# Patient Record
Sex: Male | Born: 1945 | Race: White | Hispanic: No | Smoking: Current every day smoker
Health system: Southern US, Community
[De-identification: ages and names within clinical notes are randomized; demographics above are authoritative.]

## PROBLEM LIST (undated history)

## (undated) DIAGNOSIS — I10 Essential (primary) hypertension: Secondary | ICD-10-CM

## (undated) DIAGNOSIS — E119 Type 2 diabetes mellitus without complications: Secondary | ICD-10-CM

## (undated) DIAGNOSIS — E785 Hyperlipidemia, unspecified: Secondary | ICD-10-CM

## (undated) DIAGNOSIS — I25118 Atherosclerotic heart disease of native coronary artery with other forms of angina pectoris: Secondary | ICD-10-CM

## (undated) DIAGNOSIS — R3589 Other polyuria: Secondary | ICD-10-CM

## (undated) DIAGNOSIS — F32A Depression, unspecified: Secondary | ICD-10-CM

## (undated) DIAGNOSIS — F329 Major depressive disorder, single episode, unspecified: Secondary | ICD-10-CM

## (undated) DIAGNOSIS — R358 Other polyuria: Secondary | ICD-10-CM

## (undated) DIAGNOSIS — E669 Obesity, unspecified: Secondary | ICD-10-CM

## (undated) DIAGNOSIS — I739 Peripheral vascular disease, unspecified: Secondary | ICD-10-CM

## (undated) DIAGNOSIS — I509 Heart failure, unspecified: Secondary | ICD-10-CM

## (undated) DIAGNOSIS — I6529 Occlusion and stenosis of unspecified carotid artery: Secondary | ICD-10-CM

## (undated) HISTORY — DX: Other polyuria: R35.89

## (undated) HISTORY — DX: Atherosclerotic heart disease of native coronary artery with other forms of angina pectoris: I25.118

## (undated) HISTORY — DX: Peripheral vascular disease, unspecified: I73.9

## (undated) HISTORY — DX: Depression, unspecified: F32.A

## (undated) HISTORY — DX: Essential (primary) hypertension: I10

## (undated) HISTORY — DX: Major depressive disorder, single episode, unspecified: F32.9

## (undated) HISTORY — DX: Heart failure, unspecified: I50.9

## (undated) HISTORY — DX: Obesity, unspecified: E66.9

## (undated) HISTORY — PX: CAROTID ENDARTERECTOMY: SUR193

## (undated) HISTORY — DX: Hyperlipidemia, unspecified: E78.5

## (undated) HISTORY — DX: Type 2 diabetes mellitus without complications: E11.9

## (undated) HISTORY — DX: Occlusion and stenosis of unspecified carotid artery: I65.29

## (undated) HISTORY — DX: Other polyuria: R35.8

---

## 2000-05-02 ENCOUNTER — Encounter: Payer: Self-pay | Admitting: Emergency Medicine

## 2000-05-02 ENCOUNTER — Inpatient Hospital Stay (HOSPITAL_COMMUNITY): Admission: EM | Admit: 2000-05-02 | Discharge: 2000-05-05 | Payer: Self-pay | Admitting: Emergency Medicine

## 2015-06-15 ENCOUNTER — Encounter: Payer: Self-pay | Admitting: Vascular Surgery

## 2015-06-15 ENCOUNTER — Other Ambulatory Visit: Payer: Self-pay | Admitting: *Deleted

## 2015-06-15 DIAGNOSIS — I739 Peripheral vascular disease, unspecified: Secondary | ICD-10-CM

## 2015-07-06 ENCOUNTER — Ambulatory Visit
Admission: RE | Admit: 2015-07-06 | Discharge: 2015-07-06 | Disposition: A | Discharge status: Home / Self Care | Payer: Medicare Other | Source: Ambulatory Visit | Attending: Internal Medicine | Admitting: Internal Medicine

## 2015-07-06 ENCOUNTER — Other Ambulatory Visit: Payer: Self-pay | Admitting: Internal Medicine

## 2015-07-06 DIAGNOSIS — R0781 Pleurodynia: Secondary | ICD-10-CM

## 2015-07-06 MED ORDER — IOPAMIDOL (ISOVUE-370) INJECTION 76%
80.0000 mL | Freq: Once | INTRAVENOUS | Status: AC | PRN
  Administered 2015-07-06: 80 mL via INTRAVENOUS

## 2015-07-08 ENCOUNTER — Encounter: Payer: Self-pay | Admitting: Vascular Surgery

## 2015-07-09 ENCOUNTER — Encounter: Payer: Self-pay | Admitting: Vascular Surgery

## 2015-07-09 ENCOUNTER — Ambulatory Visit (HOSPITAL_COMMUNITY)
Admission: RE | Admit: 2015-07-09 | Discharge: 2015-07-09 | Disposition: A | Discharge status: Home / Self Care | Payer: Medicare Other | Source: Ambulatory Visit | Attending: Vascular Surgery | Admitting: Vascular Surgery

## 2015-07-09 ENCOUNTER — Ambulatory Visit (INDEPENDENT_AMBULATORY_CARE_PROVIDER_SITE_OTHER)
Admission: RE | Admit: 2015-07-09 | Discharge: 2015-07-09 | Disposition: A | Discharge status: Home / Self Care | Payer: Medicare Other | Source: Ambulatory Visit | Attending: Vascular Surgery | Admitting: Vascular Surgery

## 2015-07-09 ENCOUNTER — Ambulatory Visit (INDEPENDENT_AMBULATORY_CARE_PROVIDER_SITE_OTHER): Payer: Medicare Other | Admitting: Vascular Surgery

## 2015-07-09 VITALS — BP 146/86 | HR 72 | Ht 72.0 in | Wt 252.1 lb

## 2015-07-09 DIAGNOSIS — Z9862 Peripheral vascular angioplasty status: Secondary | ICD-10-CM | POA: Diagnosis not present

## 2015-07-09 DIAGNOSIS — I10 Essential (primary) hypertension: Secondary | ICD-10-CM | POA: Diagnosis not present

## 2015-07-09 DIAGNOSIS — E119 Type 2 diabetes mellitus without complications: Secondary | ICD-10-CM | POA: Diagnosis not present

## 2015-07-09 DIAGNOSIS — I70219 Atherosclerosis of native arteries of extremities with intermittent claudication, unspecified extremity: Secondary | ICD-10-CM | POA: Diagnosis not present

## 2015-07-09 DIAGNOSIS — I739 Peripheral vascular disease, unspecified: Secondary | ICD-10-CM

## 2015-07-09 NOTE — Progress Notes (Signed)
Referred by:  Alysia Penna, MD [address] [place], [place] [address]  Reason for referral: abnormal ABI  History of Present Illness  Todd [name] is a 69 y.o. (03/02/1946) male who presents with chief complaint: B leg pain.  Onset of symptom occurred >3 years ago.  Pain is described as cramping, severity 3-6/10, and associated with ambulation ~0.25 mile.  Patient has attempted to treat this pain with left SFA angioplasty.  He has been under the care of vascular surgeon in [place].  He has also previous had a R CEA completed.  The patient has no rest pain symptoms also and no leg wounds/ulcers.  Atherosclerotic risk factors include: DM, HLD, HTN, and active smoking.  Past Medical History  Diagnosis Date  . Diabetes mellitus without complication   . Obesity   . Peripheral arterial disease   . Depression   . Carotid stenosis   . Hyperlipidemia   . Hypertension   . 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 Polyuria   . CHF (congestive heart failure)     Past Surgical History  Procedure Laterality Date  . Carotid endarterectomy      Social History   Social History  . Marital Status: Unknown    Spouse Name: N/A  . Number of Children: N/A  . Years of Education: N/A   Occupational History  . Not on file.   Social History Main Topics  . Smoking status: Current Every Day Smoker -- 1.00 packs/day    Types: Cigarettes  . Smokeless tobacco: Not on file  . Alcohol Use: 4.2 oz/week    0 Standard drinks or equivalent, 7 Glasses of wine per week  . Drug Use: No  . Sexual Activity: Not on file   Other Topics Concern  . Not on file   Social History Narrative    Family History  Problem Relation Age of Onset  . Peripheral vascular disease Father     Current Outpatient Prescriptions on File Prior to Visit  Medication Sig Dispense Refill  . aspirin 81 MG tablet Take 81 mg by mouth daily.    . citalopram (CELEXA) 40 MG tablet Take 40 mg by mouth  daily.    . insulin lispro protamine-lispro (HUMALOG 75/25 MIX) (75-25) 100 UNIT/ML SUSP injection Inject 57 Units into the skin 2 (two) times daily.    Marland Kitchen losartan (COZAAR) 25 MG tablet Take 25 mg by mouth daily.    . tamsulosin (FLOMAX) 0.4 MG CAPS capsule Take 0.4 mg by mouth daily.     No current facility-administered medications on file prior to visit.    No Known Allergies  REVIEW OF SYSTEMS:  (Positives checked otherwise negative)  CARDIOVASCULAR:  [ ]  chest pain, [ ]  chest pressure, [ ]  palpitations, [ ]  shortness of breath when laying flat, [ ]  shortness of breath with exertion,   [x]  pain in feet when walking, [ ]  pain in feet when laying flat, [ ]  history of blood clot in veins (DVT), [ ]  history of phlebitis, [ ]  swelling in legs, [ ]  varicose veins  PULMONARY:  [x]  productive cough, [ ]  asthma, [ ]  wheezing  NEUROLOGIC:  [x]  weakness in arms or legs, [ ]  numbness in arms or legs, [ ]  difficulty speaking or slurred speech, [ ]  temporary loss of vision in one eye, [ ]  dizziness  HEMATOLOGIC:  [ ]  bleeding problems, [ ]  problems with blood clotting too easily  MUSCULOSKEL:  [ ]   joint pain, [ ]  joint swelling  GASTROINTEST:  [ ]   Vomiting blood, [ ]   Blood in stool     GENITOURINARY:  [ ]   Burning with urination, [ ]   Blood in urine  PSYCHIATRIC:  [ ]  history of major depression  INTEGUMENTARY:  [ ]  rashes, [ ]  ulcers  CONSTITUTIONAL:  [ ]  fever, [ ]  chills   For VQI Use Only   PRE-ADM LIVING: Home  AMB STATUS: Ambulatory  CAD Sx: None  PRIOR CHF: Asymptomatic, History of CHF  STRESS TEST: [x]  No, [ ]  Normal, [ ]  + ischemia, [ ]  + MI, [ ]  Both    Physical Examination Filed Vitals:   07/09/15 1505  BP: 146/86  Pulse: 72  Height: 6' (1.829 m)  Weight: 252 lb 1.6 oz (114.352 kg)  SpO2: 96%   Body mass index is 34.18 kg/(m^2).  General: A&O x 3, WD, mildly obese  Head: [place]/AT  Ear/Nose/Throat: Hearing grossly intact, nares w/o erythema or drainage,  oropharynx w/o Erythema/Exudate  Eyes: PERRLA, EOMI  Neck: Supple, no nuchal rigidity, no palpable LAD, healed R neck incision  Pulmonary: Sym exp, good air movt, CTAB, no rales, rhonchi, & wheezing  Cardiac: RRR, Nl S1, S2, no Murmurs, rubs or gallops  Vascular: Vessel Right Left  Radial Palpable Palpable  Brachial  Palpable Palpable  Carotid Palpable, without bruit Palpable, without bruit  Aorta Not palpable N/A  Femoral Palpable Palpable  Popliteal Not palpable Not palpable  PT Not Palpable Not Palpable  DP Not Palpable Not Palpable   Gastrointestinal: soft, NTND, -G/R, - HSM, - masses, - CVAT B, no palpable AAA  Musculoskeletal: M/S 5/5 throughout , Extremities without ischemic changes   Neurologic: CN 2-12 intact , Pain and light touch intact in extremities , Motor exam as listed above  Psychiatric: Judgment intact, Mood & affect appropriatefor pt's clinical situation  Dermatologic: See M/S exam for extremity exam, no rashes otherwise noted  Lymph : No Cervical, Axillary, or Inguinal lymphadenopathy    Non-Invasive Vascular Imaging  ABI (Date: 07/09/2015)  R: 0.69, DP: mono, PT: mono, TBI: 0.48  L: 0.64, DP: mono, PT: mono, TBI: 0.39  BLE arterial duplex (07/09/2015)  Occluded B SFA  Possible R PFA stenosis  Patent popliteal arteries with 3 vessel runoff  Outside Studies/Documentation 6 pages of outside documents were reviewed including: outpatient PCP records including outside B carotid duplex and outside ABI.   Medical Decision Making  Todd [name] is a 69 y.o. male who presents with: BLE intermittent claudication, s/p R CEA (outside practice)   I discussed with the patient the natural history of intermittent claudication: 75% of patients have stable or improved symptoms in a year an only 2% require amputation. Eventually 20% may require intervention in a year.  I discussed in depth with the patient the nature of atherosclerosis, and emphasized the  importance of maximal medical management including strict control of blood pressure, blood glucose, and lipid levels, antiplatelet agent, obtaining regular exercise, and cessation of smoking.    The patient is aware that without maximal medical management the underlying atherosclerotic disease process will progress, limiting the benefit of any interventions.  I discussed in depth with the patient a walking plan and how to execute such.  The patient is not interested in starting Pletal. The patient is currently on a statin: reported has some intolerance to such.  Would trial the patient on different agents to see if one can be found that he tolerates.  The patient is currently on an anti-platelet: ASA.  Repeat the ABI in 3 months.  His R foot perfusion is dependent on his profunda femoral artery collaterals, which might eventually become compromised by the stenosis in that vessel.  The L SFA occlusion despite angioplasty is pretty much the normal given the poor patencies with endovascular intervention on the SFA.  Follow up in 3 months.  Thank you for allowing [place] to participate in this patient's care.  Leonides Sake, MD [facility] Office: [phone] Pager: [phone]  07/09/2015, 5:19 PM

## 2015-10-08 ENCOUNTER — Ambulatory Visit: Payer: Medicare Other | Admitting: Vascular Surgery

## 2015-10-08 ENCOUNTER — Encounter (HOSPITAL_COMMUNITY): Payer: Medicare Other

## 2016-02-07 ENCOUNTER — Encounter: Payer: Self-pay | Admitting: Vascular Surgery

## 2016-02-11 ENCOUNTER — Encounter: Payer: Self-pay | Admitting: Vascular Surgery

## 2016-02-11 ENCOUNTER — Ambulatory Visit (HOSPITAL_COMMUNITY)
Admission: RE | Admit: 2016-02-11 | Discharge: 2016-02-11 | Disposition: A | Discharge status: Home / Self Care | Payer: Medicare Other | Source: Ambulatory Visit | Attending: Vascular Surgery | Admitting: Vascular Surgery

## 2016-02-11 ENCOUNTER — Ambulatory Visit (INDEPENDENT_AMBULATORY_CARE_PROVIDER_SITE_OTHER): Payer: Medicare Other | Admitting: Vascular Surgery

## 2016-02-11 ENCOUNTER — Other Ambulatory Visit: Payer: Self-pay | Admitting: *Deleted

## 2016-02-11 VITALS — BP 188/86 | HR 61 | Ht 72.0 in | Wt 255.1 lb

## 2016-02-11 DIAGNOSIS — E119 Type 2 diabetes mellitus without complications: Secondary | ICD-10-CM | POA: Diagnosis not present

## 2016-02-11 DIAGNOSIS — E785 Hyperlipidemia, unspecified: Secondary | ICD-10-CM | POA: Insufficient documentation

## 2016-02-11 DIAGNOSIS — I739 Peripheral vascular disease, unspecified: Secondary | ICD-10-CM

## 2016-02-11 DIAGNOSIS — I70213 Atherosclerosis of native arteries of extremities with intermittent claudication, bilateral legs: Secondary | ICD-10-CM | POA: Diagnosis not present

## 2016-02-11 DIAGNOSIS — I509 Heart failure, unspecified: Secondary | ICD-10-CM | POA: Insufficient documentation

## 2016-02-11 DIAGNOSIS — F329 Major depressive disorder, single episode, unspecified: Secondary | ICD-10-CM | POA: Insufficient documentation

## 2016-02-11 DIAGNOSIS — I6523 Occlusion and stenosis of bilateral carotid arteries: Secondary | ICD-10-CM

## 2016-02-11 DIAGNOSIS — I110 Hypertensive heart disease with heart failure: Secondary | ICD-10-CM | POA: Insufficient documentation

## 2016-02-11 DIAGNOSIS — R0989 Other specified symptoms and signs involving the circulatory and respiratory systems: Secondary | ICD-10-CM | POA: Diagnosis present

## 2016-02-11 NOTE — Progress Notes (Signed)
[facility]  Patient name: Todd [name] MRN: [id number] DOB: 12/13/1945 Sex: male  REASON FOR VISIT: Follow-up claudication  HPI: Todd [name] is a 70 y.o. male who presents for follow-up of his bilateral leg claudication. The patient has known bilateral SFA occlusion. The patient was last seen 6 months ago. At that time, he complained of bilateral calf claudication with ambulating a quarter mile. The patient has previously been treated with left SFA angioplasty by a vascular surgeon in [place]. The patient denies any rest pain or nonhealing wounds. The patient states that he has not been very motivated to walk recently. He states that his ambulation distance is about the same.  The patient continues to smoke a pack to pack and a half a day. He has a past medical history of diabetes on insulin, hyperlipidemia and hypertension. He is status post right carotid endarterectomy from outside practice.  The patient is on aspirin. He reports an intolerance to statins.   Past Medical History  Diagnosis Date  . Diabetes mellitus without complication (HCC)   . Obesity   . Peripheral arterial disease (HCC)   . Depression   . Carotid stenosis   . Hyperlipidemia   . Hypertension   . 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 Polyuria   . CHF (congestive heart failure) (HCC)     Family History  Problem Relation Age of Onset  . Peripheral vascular disease Father     SOCIAL HISTORY: Social History  Substance Use Topics  . Smoking status: Current Every Day Smoker -- 1.00 packs/day    Types: Cigarettes  . Smokeless tobacco: Not on file  . Alcohol Use: 4.2 oz/week    0 Standard drinks or equivalent, 7 Glasses of wine per week    No Known Allergies  Current Outpatient Prescriptions  Medication Sig Dispense Refill  . aspirin 81 MG tablet Take 81 mg by mouth daily.    . citalopram (CELEXA) 40 MG tablet Take 40 mg by mouth daily.    .  CRESTOR 10 MG tablet     . HUMALOG MIX 75/25 KWIKPEN (75-25) 100 UNIT/ML Kwikpen     . insulin lispro protamine-lispro (HUMALOG 75/25 MIX) (75-25) 100 UNIT/ML SUSP injection Inject 57 Units into the skin 2 (two) times daily.    Marland Kitchen. losartan (COZAAR) 25 MG tablet Take 25 mg by mouth daily.    . methocarbamol (ROBAXIN) 500 MG tablet     . tamsulosin (FLOMAX) 0.4 MG CAPS capsule Take 0.4 mg by mouth daily.    . traMADol (ULTRAM) 50 MG tablet     . HYDROcodone-acetaminophen (NORCO/VICODIN) 5-325 MG per tablet Reported on 02/11/2016  0  . Oxycodone-Acetaminophen (PERCOCET PO) Take by mouth. Reported on 02/11/2016    . oxyCODONE-acetaminophen (PERCOCET/ROXICET) 5-325 MG per tablet Reported on 02/11/2016     No current facility-administered medications for this visit.    REVIEW OF SYSTEMS:  [X]  denotes positive finding, [ ]  denotes negative finding Cardiac  Comments:  Chest pain or chest pressure:    Shortness of breath upon exertion:    Short of breath when lying flat:    Irregular heart rhythm:        Vascular    Pain in calf, thigh, or hip brought on by ambulation:    Pain in feet at night that wakes you up from your sleep:     Blood clot in your veins:  Leg swelling:         Pulmonary    Oxygen at home:    Productive cough:  x   Wheezing:         Neurologic    Sudden weakness in arms or legs:     Sudden numbness in arms or legs:     Sudden onset of difficulty speaking or slurred speech:    Temporary loss of vision in one eye:     Problems with dizziness:         Gastrointestinal    Blood in stool:     Vomited blood:         Genitourinary    Burning when urinating:     Blood in urine:        Psychiatric    Major depression:         Hematologic    Bleeding problems:    Problems with blood clotting too easily:        Skin    Rashes or ulcers:        Constitutional    Fever or chills:      PHYSICAL EXAM: Filed Vitals:   02/11/16 1442 02/11/16 1444  BP: 186/87  188/86  Pulse: 61   Height: 6' (1.829 m)   Weight: 255 lb 1.6 oz (115.713 kg)   SpO2: 95%     GENERAL: The patient is a well-nourished male, in no acute distress. The vital signs are documented above. CARDIAC: There is a regular rate and rhythm. No carotid bruits. VASCULAR:  2+ radial pulses bilaterally, 2+ femoral pulses bilaterally, nonpalpable popliteal and pedal pulses bilaterally PULMONARY: There is good air exchange bilaterally without wheezing or rales. ABDOMEN: Soft and non-tender  MUSCULOSKELETAL: There are no major deformities or cyanosis. NEUROLOGIC: No focal weakness or paresthesias are detected. SKIN: There are no ulcers or rashes  of lower extremities. PSYCHIATRIC: The patient has a normal affect.  DATA:  His ABIs 02/11/2016  Right lower extremity: 0.66 with monophasic posterior tibial and dorsalis pedis waveforms. TBI 0.68 Left lower extremity: 0.65 with monophasic posterior tibial and dorsalis pedis waveforms. 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 Fortunately, he has not developed rest pain or nonhealing wounds. The patient continues to smoke. He was counseled for greater than 3 minutes on smoking cessation. Discussed maximal medical management with control of blood pressure, lipid levels and blood glucose. The patient is on aspirin daily. He is not on a statin due to intolerance. Advised the patient to start a walking program. The patient will follow-up in 6 months with ABIs and carotid duplex. He knows to return sooner if he develops any worsening symptoms or nonhealing wounds.   Maris Berger, PA-C [facility]  Addendum  I have independently interviewed and examined the patient, and I agree with the  physician assistant's findings.  Pt continues to have non-lifestyle limiting intermittent claudication.  We rediscussed maximal medical management including walking pain.  He needs to work on smoking cessation.  He will follow up with surveillance studies in 6 months.  Leonides Sake, MD [facility] Office: [phone] Pager: [phone]  02/11/2016, 3:56 PM

## 2016-02-15 ENCOUNTER — Encounter: Payer: Self-pay | Admitting: Internal Medicine

## 2016-07-18 ENCOUNTER — Encounter: Payer: Self-pay | Admitting: Vascular Surgery

## 2016-08-11 ENCOUNTER — Encounter: Payer: Self-pay | Admitting: Vascular Surgery

## 2016-08-18 ENCOUNTER — Ambulatory Visit (INDEPENDENT_AMBULATORY_CARE_PROVIDER_SITE_OTHER)
Admission: RE | Admit: 2016-08-18 | Discharge: 2016-08-18 | Disposition: A | Discharge status: Home / Self Care | Payer: Medicare Other | Source: Ambulatory Visit | Attending: Vascular Surgery | Admitting: Vascular Surgery

## 2016-08-18 ENCOUNTER — Ambulatory Visit (HOSPITAL_COMMUNITY)
Admission: RE | Admit: 2016-08-18 | Discharge: 2016-08-18 | Disposition: A | Discharge status: Home / Self Care | Payer: Medicare Other | Source: Ambulatory Visit | Attending: Vascular Surgery | Admitting: Vascular Surgery

## 2016-08-18 ENCOUNTER — Ambulatory Visit: Payer: Medicare Other | Admitting: Vascular Surgery

## 2016-08-18 DIAGNOSIS — I10 Essential (primary) hypertension: Secondary | ICD-10-CM | POA: Insufficient documentation

## 2016-08-18 DIAGNOSIS — I739 Peripheral vascular disease, unspecified: Secondary | ICD-10-CM | POA: Insufficient documentation

## 2016-08-18 DIAGNOSIS — I6523 Occlusion and stenosis of bilateral carotid arteries: Secondary | ICD-10-CM | POA: Insufficient documentation

## 2016-08-18 DIAGNOSIS — E119 Type 2 diabetes mellitus without complications: Secondary | ICD-10-CM | POA: Diagnosis not present

## 2016-08-18 DIAGNOSIS — Z720 Tobacco use: Secondary | ICD-10-CM | POA: Insufficient documentation

## 2016-08-18 LAB — VAS US CAROTID
LCCADDIAS: -19 cm/s
LCCAPSYS: 106 cm/s
LEFT ECA DIAS: -16 cm/s
Left CCA dist sys: -77 cm/s
Left CCA prox dias: 18 cm/s
Left ICA dist dias: -20 cm/s
Left ICA dist sys: -65 cm/s
Left ICA prox dias: -15 cm/s
Left ICA prox sys: -58 cm/s
RCCADSYS: -61 cm/s
RCCAPDIAS: 9 cm/s
RCCAPSYS: 71 cm/s
RIGHT CCA MID DIAS: 12 cm/s
RIGHT ECA DIAS: -12 cm/s

## 2016-08-22 ENCOUNTER — Encounter: Payer: Self-pay | Admitting: Vascular Surgery

## 2016-08-28 ENCOUNTER — Encounter: Payer: Self-pay | Admitting: Vascular Surgery

## 2016-08-28 ENCOUNTER — Ambulatory Visit (INDEPENDENT_AMBULATORY_CARE_PROVIDER_SITE_OTHER): Payer: Medicare Other | Admitting: Vascular Surgery

## 2016-08-28 VITALS — BP 154/94 | HR 81 | Temp 98.3°F | Resp 16 | Ht 72.0 in | Wt 247.0 lb

## 2016-08-28 DIAGNOSIS — I6523 Occlusion and stenosis of bilateral carotid arteries: Secondary | ICD-10-CM | POA: Insufficient documentation

## 2016-08-28 DIAGNOSIS — I739 Peripheral vascular disease, unspecified: Secondary | ICD-10-CM | POA: Insufficient documentation

## 2016-08-28 DIAGNOSIS — I70213 Atherosclerosis of native arteries of extremities with intermittent claudication, bilateral legs: Secondary | ICD-10-CM

## 2016-08-28 NOTE — Progress Notes (Signed)
Patient name: Todd [name] MRN: [id number] DOB: Aug 27, 1946 Sex: male    HPI: Todd MIMBS is a 70 y.o. male,  Who is here for a 6 month follow visit.  He had a right CEA and a left SFA angioplasty in [place], [place].  He denise ulcers and rest pain in B LE.  He denise symptoms of stroke or TIA.   Other medical problems include  Atherosclerotic risk factors include: DM, HLD, HTN, and active smoking.  Past Medical History:  Diagnosis Date  . 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 Carotid stenosis   . CHF (congestive heart failure) (HCC)   . Depression   . Diabetes mellitus without complication (HCC)   . Hyperlipidemia   . Hypertension   . Obesity   . Peripheral arterial disease (HCC)   . Polyuria    Past Surgical History:  Procedure Laterality Date  . CAROTID ENDARTERECTOMY      Family History  Problem Relation Age of Onset  . Peripheral vascular disease Father     SOCIAL HISTORY: Social History   Social History  . Marital status: Unknown    Spouse name: N/A  . Number of children: N/A  . Years of education: N/A   Occupational History  . Not on file.   Social History Main Topics  . Smoking status: Current Every Day Smoker    Types: Cigarettes  . Smokeless tobacco: Never Used     Comment: Up 1 1/2 pks per day  . Alcohol use 4.2 oz/week    7 Glasses of wine per week  . Drug use: No  . Sexual activity: Not on file   Other Topics Concern  . Not on file   Social History Narrative  . No narrative on file    No Known Allergies  Current Outpatient Prescriptions  Medication Sig Dispense Refill  . aspirin 81 MG tablet Take 81 mg by mouth daily.    . citalopram (CELEXA) 40 MG tablet Take 40 mg by mouth daily.    . CRESTOR 10 MG tablet     . HUMALOG MIX 75/25 KWIKPEN (75-25) 100 UNIT/ML Kwikpen     . HYDROcodone-acetaminophen (NORCO/VICODIN) 5-325 MG per tablet Reported on 02/11/2016  0  . insulin lispro protamine-lispro  (HUMALOG 75/25 MIX) (75-25) 100 UNIT/ML SUSP injection Inject 57 Units into the skin 2 (two) times daily.    Marland Kitchen losartan (COZAAR) 25 MG tablet Take 25 mg by mouth daily.    . methocarbamol (ROBAXIN) 500 MG tablet     . Oxycodone-Acetaminophen (PERCOCET PO) Take by mouth. Reported on 02/11/2016    . oxyCODONE-acetaminophen (PERCOCET/ROXICET) 5-325 MG per tablet Reported on 02/11/2016    . tamsulosin (FLOMAX) 0.4 MG CAPS capsule Take 0.4 mg by mouth daily.    . traMADol (ULTRAM) 50 MG tablet      No current facility-administered medications for this visit.     ROS:   General:  No weight loss, Fever, chills  HEENT: No recent headaches, no nasal bleeding, no visual changes, no sore throat  Neurologic: No dizziness, blackouts, seizures. No recent symptoms of stroke or mini- stroke. No recent episodes of slurred speech, or temporary blindness.  Cardiac: No recent episodes of chest pain/pressure, no shortness of breath at rest.  No shortness of breath with exertion.  Denies history of atrial fibrillation or irregular heartbeat  Vascular: No history of rest pain in feet.  No history  of claudication.  No history of non-healing ulcer, No history of DVT   Pulmonary: No home oxygen, no productive cough, no hemoptysis,  No asthma or wheezing  Musculoskeletal:  [ ]  Arthritis, [ ]  Low back pain,  [ ]  Joint pain  Hematologic:No history of hypercoagulable state.  No history of easy bleeding.  No history of anemia  Gastrointestinal: No hematochezia or melena,  No gastroesophageal reflux, no trouble swallowing  Urinary: [ ]  chronic Kidney disease, [ ]  on HD - [ ]  MWF or [ ]  TTHS, [ ]  Burning with urination, [ ]  Frequent urination, [ ]  Difficulty urinating;   Skin: No rashes  Psychological: No history of anxiety,  No history of depression   Physical Examination  Vitals:   08/28/16 1257 08/28/16 1302  BP: (!) 165/86 (!) 154/94  Pulse: 81   Resp: 16   Temp: 98.3 F (36.8 C)   TempSrc: Oral     SpO2: 93%   Weight: 247 lb (112 kg)   Height: 6' (1.829 m)     Body mass index is 33.5 kg/m.  General:  Alert and oriented, no acute distress HEENT: Normal Neck: No bruit or JVD Pulmonary: Clear to auscultation bilaterally Cardiac: Regular Rate and Rhythm without murmur Abdomen: Soft, non-tender, non-distended, no mass, no scars Skin: No rash Extremity Pulses:  2+ radial, brachial, femoral, dorsalis pedis, posterior tibial pulses bilaterally Musculoskeletal: No deformity or edema  Neurologic: Upper and lower extremity motor 5/5 and symmetric  DATA:  ABI Right 0.62 Left 0.68 Carotid duplex Bilaterall 1-39% stenosis with a patent right carotid s/p CEA.   ASSESSMENT:   PAD with symptoms of claudication Carotid stenosis s/p right CEA   PLAN:   Continue to maintain mobility.  Check feet daily for wounds.  His symptoms are stable without new complications.  He will f/u for repeat ABI in 6 months and carotid duplex in 1 year.  If he has symptoms of rest pain or wounds on his LE he will call sooner.   Todd [name], Todd [name] MAUREEN PA-C [facility] The patient was seen in conjunction with Dr. Imogene Burnhen today  Addendum  I have independently interviewed and examined the patient, and I agree with the physician assistant's findings.  R CEA site appears patent.  Minimal disease in L ICA.  Pt's BLE ABI appear to be stable.  He has minimal sx at this point.    - Repeat surveillance studies in 6 months - Maximal medical mgmt - Smoking cessation again emphasized  Todd SakeBrian Chen, MD, FACS [facility] Office: [phone] Pager: [phone]  08/28/2016, 2:05 PM

## 2016-08-28 NOTE — Addendum Note (Signed)
Addended by: Yolonda KidaEVANS, ASHLEIGH N on: 08/28/2016 04:58 PM   Modules accepted: Orders

## 2017-02-22 ENCOUNTER — Encounter: Payer: Self-pay | Admitting: Vascular Surgery

## 2017-03-02 ENCOUNTER — Ambulatory Visit (HOSPITAL_COMMUNITY): Payer: Medicare Other

## 2017-03-02 ENCOUNTER — Ambulatory Visit: Payer: Medicare Other | Admitting: Vascular Surgery

## 2017-11-25 ENCOUNTER — Emergency Department (HOSPITAL_COMMUNITY): Payer: Medicare Other

## 2017-11-25 ENCOUNTER — Inpatient Hospital Stay (HOSPITAL_COMMUNITY)
Admission: EM | Admit: 2017-11-25 | Discharge: 2017-12-21 | DRG: 061 | Disposition: E | Payer: Medicare Other | Attending: Neurology | Admitting: Neurology

## 2017-11-25 ENCOUNTER — Inpatient Hospital Stay (HOSPITAL_COMMUNITY): Payer: Medicare Other

## 2017-11-25 ENCOUNTER — Emergency Department (HOSPITAL_COMMUNITY): Payer: Medicare Other | Admitting: Certified Registered Nurse Anesthetist

## 2017-11-25 ENCOUNTER — Encounter (HOSPITAL_COMMUNITY): Payer: Self-pay | Admitting: Radiology

## 2017-11-25 ENCOUNTER — Encounter (HOSPITAL_COMMUNITY): Admission: EM | Disposition: E | Payer: Self-pay | Source: Home / Self Care | Attending: Neurology

## 2017-11-25 DIAGNOSIS — J9601 Acute respiratory failure with hypoxia: Secondary | ICD-10-CM | POA: Diagnosis not present

## 2017-11-25 DIAGNOSIS — E785 Hyperlipidemia, unspecified: Secondary | ICD-10-CM | POA: Diagnosis present

## 2017-11-25 DIAGNOSIS — I63512 Cerebral infarction due to unspecified occlusion or stenosis of left middle cerebral artery: Principal | ICD-10-CM | POA: Diagnosis present

## 2017-11-25 DIAGNOSIS — R1310 Dysphagia, unspecified: Secondary | ICD-10-CM | POA: Diagnosis not present

## 2017-11-25 DIAGNOSIS — R001 Bradycardia, unspecified: Secondary | ICD-10-CM | POA: Diagnosis not present

## 2017-11-25 DIAGNOSIS — Y92230 Patient room in hospital as the place of occurrence of the external cause: Secondary | ICD-10-CM | POA: Diagnosis not present

## 2017-11-25 DIAGNOSIS — R0602 Shortness of breath: Secondary | ICD-10-CM

## 2017-11-25 DIAGNOSIS — Z515 Encounter for palliative care: Secondary | ICD-10-CM | POA: Diagnosis not present

## 2017-11-25 DIAGNOSIS — J690 Pneumonitis due to inhalation of food and vomit: Secondary | ICD-10-CM | POA: Diagnosis not present

## 2017-11-25 DIAGNOSIS — T448X5A Adverse effect of centrally-acting and adrenergic-neuron-blocking agents, initial encounter: Secondary | ICD-10-CM | POA: Diagnosis not present

## 2017-11-25 DIAGNOSIS — E1149 Type 2 diabetes mellitus with other diabetic neurological complication: Secondary | ICD-10-CM | POA: Diagnosis present

## 2017-11-25 DIAGNOSIS — I429 Cardiomyopathy, unspecified: Secondary | ICD-10-CM | POA: Diagnosis present

## 2017-11-25 DIAGNOSIS — Z66 Do not resuscitate: Secondary | ICD-10-CM | POA: Diagnosis not present

## 2017-11-25 DIAGNOSIS — R062 Wheezing: Secondary | ICD-10-CM | POA: Diagnosis not present

## 2017-11-25 DIAGNOSIS — H518 Other specified disorders of binocular movement: Secondary | ICD-10-CM | POA: Diagnosis present

## 2017-11-25 DIAGNOSIS — I672 Cerebral atherosclerosis: Secondary | ICD-10-CM | POA: Diagnosis present

## 2017-11-25 DIAGNOSIS — R29717 NIHSS score 17: Secondary | ICD-10-CM | POA: Diagnosis present

## 2017-11-25 DIAGNOSIS — Z9911 Dependence on respirator [ventilator] status: Secondary | ICD-10-CM | POA: Diagnosis not present

## 2017-11-25 DIAGNOSIS — I63312 Cerebral infarction due to thrombosis of left middle cerebral artery: Secondary | ICD-10-CM | POA: Diagnosis not present

## 2017-11-25 DIAGNOSIS — R0603 Acute respiratory distress: Secondary | ICD-10-CM | POA: Diagnosis not present

## 2017-11-25 DIAGNOSIS — G8101 Flaccid hemiplegia affecting right dominant side: Secondary | ICD-10-CM | POA: Diagnosis present

## 2017-11-25 DIAGNOSIS — E876 Hypokalemia: Secondary | ICD-10-CM | POA: Diagnosis not present

## 2017-11-25 DIAGNOSIS — Z8673 Personal history of transient ischemic attack (TIA), and cerebral infarction without residual deficits: Secondary | ICD-10-CM

## 2017-11-25 DIAGNOSIS — R471 Dysarthria and anarthria: Secondary | ICD-10-CM | POA: Diagnosis present

## 2017-11-25 DIAGNOSIS — Z7982 Long term (current) use of aspirin: Secondary | ICD-10-CM

## 2017-11-25 DIAGNOSIS — F10231 Alcohol dependence with withdrawal delirium: Secondary | ICD-10-CM | POA: Diagnosis not present

## 2017-11-25 DIAGNOSIS — I5032 Chronic diastolic (congestive) heart failure: Secondary | ICD-10-CM | POA: Diagnosis present

## 2017-11-25 DIAGNOSIS — I110 Hypertensive heart disease with heart failure: Secondary | ICD-10-CM | POA: Diagnosis present

## 2017-11-25 DIAGNOSIS — I6340 Cerebral infarction due to embolism of unspecified cerebral artery: Secondary | ICD-10-CM | POA: Diagnosis not present

## 2017-11-25 DIAGNOSIS — I6300 Cerebral infarction due to thrombosis of unspecified precerebral artery: Secondary | ICD-10-CM | POA: Diagnosis not present

## 2017-11-25 DIAGNOSIS — R9389 Abnormal findings on diagnostic imaging of other specified body structures: Secondary | ICD-10-CM | POA: Diagnosis not present

## 2017-11-25 DIAGNOSIS — J811 Chronic pulmonary edema: Secondary | ICD-10-CM

## 2017-11-25 DIAGNOSIS — Z6830 Body mass index (BMI) 30.0-30.9, adult: Secondary | ICD-10-CM | POA: Diagnosis not present

## 2017-11-25 DIAGNOSIS — I959 Hypotension, unspecified: Secondary | ICD-10-CM | POA: Diagnosis not present

## 2017-11-25 DIAGNOSIS — I10 Essential (primary) hypertension: Secondary | ICD-10-CM | POA: Diagnosis not present

## 2017-11-25 DIAGNOSIS — F17210 Nicotine dependence, cigarettes, uncomplicated: Secondary | ICD-10-CM | POA: Diagnosis present

## 2017-11-25 DIAGNOSIS — I361 Nonrheumatic tricuspid (valve) insufficiency: Secondary | ICD-10-CM | POA: Diagnosis not present

## 2017-11-25 DIAGNOSIS — I639 Cerebral infarction, unspecified: Secondary | ICD-10-CM

## 2017-11-25 DIAGNOSIS — E669 Obesity, unspecified: Secondary | ICD-10-CM | POA: Diagnosis present

## 2017-11-25 DIAGNOSIS — F329 Major depressive disorder, single episode, unspecified: Secondary | ICD-10-CM | POA: Diagnosis present

## 2017-11-25 DIAGNOSIS — E1151 Type 2 diabetes mellitus with diabetic peripheral angiopathy without gangrene: Secondary | ICD-10-CM | POA: Diagnosis present

## 2017-11-25 DIAGNOSIS — Z794 Long term (current) use of insulin: Secondary | ICD-10-CM

## 2017-11-25 DIAGNOSIS — Z8249 Family history of ischemic heart disease and other diseases of the circulatory system: Secondary | ICD-10-CM

## 2017-11-25 DIAGNOSIS — R29810 Facial weakness: Secondary | ICD-10-CM | POA: Diagnosis present

## 2017-11-25 DIAGNOSIS — I6350 Cerebral infarction due to unspecified occlusion or stenosis of unspecified cerebral artery: Secondary | ICD-10-CM | POA: Diagnosis not present

## 2017-11-25 DIAGNOSIS — E118 Type 2 diabetes mellitus with unspecified complications: Secondary | ICD-10-CM | POA: Diagnosis not present

## 2017-11-25 DIAGNOSIS — J449 Chronic obstructive pulmonary disease, unspecified: Secondary | ICD-10-CM | POA: Diagnosis present

## 2017-11-25 DIAGNOSIS — I2510 Atherosclerotic heart disease of native coronary artery without angina pectoris: Secondary | ICD-10-CM | POA: Diagnosis present

## 2017-11-25 DIAGNOSIS — R911 Solitary pulmonary nodule: Secondary | ICD-10-CM | POA: Diagnosis present

## 2017-11-25 DIAGNOSIS — F10931 Alcohol use, unspecified with withdrawal delirium: Secondary | ICD-10-CM | POA: Diagnosis not present

## 2017-11-25 DIAGNOSIS — Z4659 Encounter for fitting and adjustment of other gastrointestinal appliance and device: Secondary | ICD-10-CM

## 2017-11-25 DIAGNOSIS — Z9181 History of falling: Secondary | ICD-10-CM

## 2017-11-25 DIAGNOSIS — J441 Chronic obstructive pulmonary disease with (acute) exacerbation: Secondary | ICD-10-CM | POA: Diagnosis not present

## 2017-11-25 DIAGNOSIS — J9690 Respiratory failure, unspecified, unspecified whether with hypoxia or hypercapnia: Secondary | ICD-10-CM

## 2017-11-25 HISTORY — PX: IR ANGIO VERTEBRAL SEL VERTEBRAL UNI L MOD SED: IMG5367

## 2017-11-25 HISTORY — PX: RADIOLOGY WITH ANESTHESIA: SHX6223

## 2017-11-25 HISTORY — PX: IR ANGIO VERTEBRAL SEL SUBCLAVIAN INNOMINATE UNI R MOD SED: IMG5365

## 2017-11-25 HISTORY — PX: IR ANGIO INTRA EXTRACRAN SEL COM CAROTID INNOMINATE BILAT MOD SED: IMG5360

## 2017-11-25 LAB — COMPREHENSIVE METABOLIC PANEL
ALBUMIN: 3.4 g/dL — AB (ref 3.5–5.0)
ALT: 19 U/L (ref 17–63)
AST: 20 U/L (ref 15–41)
Alkaline Phosphatase: 73 U/L (ref 38–126)
Anion gap: 8 (ref 5–15)
BUN: 13 mg/dL (ref 6–20)
CHLORIDE: 102 mmol/L (ref 101–111)
CO2: 25 mmol/L (ref 22–32)
CREATININE: 0.76 mg/dL (ref 0.61–1.24)
Calcium: 9.5 mg/dL (ref 8.9–10.3)
GFR calc Af Amer: >60 mL/min (ref >60)
GFR calc non Af Amer: >60 mL/min (ref >60)
Glucose, Bld: 190 mg/dL — ABNORMAL HIGH (ref 65–99)
Potassium: 3.3 mmol/L — ABNORMAL LOW (ref 3.5–5.1)
SODIUM: 135 mmol/L (ref 135–145)
Total Bilirubin: 0.7 mg/dL (ref 0.3–1.2)
Total Protein: 7.4 g/dL (ref 6.5–8.1)

## 2017-11-25 LAB — POCT I-STAT 3, ART BLOOD GAS (G3+)
Acid-Base Excess: 5 mmol/L — ABNORMAL HIGH (ref 0.0–2.0)
Acid-Base Excess: 2 mmol/L (ref 0.0–2.0)
Bicarbonate: 30.6 mmol/L — ABNORMAL HIGH (ref 20.0–28.0)
Bicarbonate: 28.5 mmol/L — ABNORMAL HIGH (ref 20.0–28.0)
O2 SAT: 92 %
O2 SAT: 91 %
PCO2 ART: 48.7 mmHg — AB (ref 32.0–48.0)
PCO2 ART: 47.9 mmHg (ref 32.0–48.0)
PH ART: 7.406 (ref 7.350–7.450)
PH ART: 7.38 (ref 7.350–7.450)
PO2 ART: 65 mmHg — AB (ref 83.0–108.0)
PO2 ART: 61 mmHg — AB (ref 83.0–108.0)
Patient temperature: 98.6
Patient temperature: 97.7
TCO2: 32 mmol/L (ref 22–32)
TCO2: 30 mmol/L (ref 22–32)

## 2017-11-25 LAB — DIFFERENTIAL
BASOS ABS: 0 10*3/uL (ref 0.0–0.1)
BASOS PCT: 0 %
Eosinophils Absolute: 0.1 10*3/uL (ref 0.0–0.7)
Eosinophils Relative: 1 %
Lymphocytes Relative: 14 %
Lymphs Abs: 1.5 10*3/uL (ref 0.7–4.0)
Monocytes Absolute: 0.8 10*3/uL (ref 0.1–1.0)
Monocytes Relative: 8 %
NEUTROS ABS: 7.6 10*3/uL (ref 1.7–7.7)
Neutrophils Relative %: 77 %

## 2017-11-25 LAB — CBC
HEMATOCRIT: 44.8 % (ref 39.0–52.0)
Hemoglobin: 15.3 g/dL (ref 13.0–17.0)
MCH: 30.7 pg (ref 26.0–34.0)
MCHC: 34.2 g/dL (ref 30.0–36.0)
MCV: 89.8 fL (ref 78.0–100.0)
PLATELETS: 248 10*3/uL (ref 150–400)
RBC: 4.99 MIL/uL (ref 4.22–5.81)
RDW: 12.9 % (ref 11.5–15.5)
WBC: 10.1 10*3/uL (ref 4.0–10.5)

## 2017-11-25 LAB — GLUCOSE, CAPILLARY
GLUCOSE-CAPILLARY: 191 mg/dL — AB (ref 65–99)
Glucose-Capillary: 190 mg/dL — ABNORMAL HIGH (ref 65–99)

## 2017-11-25 LAB — I-STAT CHEM 8, ED
BUN: 14 mg/dL (ref 6–20)
CREATININE: 0.7 mg/dL (ref 0.61–1.24)
Calcium, Ion: 1.14 mmol/L — ABNORMAL LOW (ref 1.15–1.40)
Chloride: 102 mmol/L (ref 101–111)
Glucose, Bld: 192 mg/dL — ABNORMAL HIGH (ref 65–99)
HEMATOCRIT: 45 % (ref 39.0–52.0)
HEMOGLOBIN: 15.3 g/dL (ref 13.0–17.0)
POTASSIUM: 3.4 mmol/L — AB (ref 3.5–5.1)
Sodium: 139 mmol/L (ref 135–145)
TCO2: 23 mmol/L (ref 22–32)

## 2017-11-25 LAB — PROTIME-INR
INR: 1.1
PROTHROMBIN TIME: 14.2 s (ref 11.4–15.2)

## 2017-11-25 LAB — APTT: APTT: 26 s (ref 24–36)

## 2017-11-25 LAB — I-STAT TROPONIN, ED: Troponin i, poc: 0.02 ng/mL (ref 0.00–0.08)

## 2017-11-25 LAB — HEMOGLOBIN A1C
HEMOGLOBIN A1C: 7.7 % — AB (ref 4.8–5.6)
MEAN PLASMA GLUCOSE: 174.29 mg/dL

## 2017-11-25 LAB — CBG MONITORING, ED: Glucose-Capillary: 188 mg/dL — ABNORMAL HIGH (ref 65–99)

## 2017-11-25 SURGERY — RADIOLOGY WITH ANESTHESIA
Anesthesia: General

## 2017-11-25 MED ORDER — ROCURONIUM BROMIDE 100 MG/10ML IV SOLN
INTRAVENOUS | Status: DC | PRN
  Administered 2017-11-25: 50 mg via INTRAVENOUS
  Administered 2017-11-25: 20 mg via INTRAVENOUS

## 2017-11-25 MED ORDER — NICARDIPINE HCL IN NACL 20-0.86 MG/200ML-% IV SOLN
3.0000 mg/h | INTRAVENOUS | Status: DC
  Administered 2017-11-25 – 2017-11-28 (×2): 5 mg/h via INTRAVENOUS
  Administered 2017-11-28: 3 mg/h via INTRAVENOUS
  Filled 2017-11-25 (×3): qty 200

## 2017-11-25 MED ORDER — PROPOFOL 10 MG/ML IV BOLUS
INTRAVENOUS | Status: DC | PRN
  Administered 2017-11-25: 150 mg via INTRAVENOUS

## 2017-11-25 MED ORDER — FENTANYL CITRATE (PF) 100 MCG/2ML IJ SOLN: 50.0000 ug | Freq: Once | INTRAMUSCULAR | Status: DC

## 2017-11-25 MED ORDER — LIDOCAINE HCL (CARDIAC) 20 MG/ML IV SOLN
INTRAVENOUS | Status: DC | PRN
  Administered 2017-11-25: 80 mg via INTRAVENOUS

## 2017-11-25 MED ORDER — ACETAMINOPHEN 650 MG RE SUPP: 650.0000 mg | RECTAL | Status: DC | PRN

## 2017-11-25 MED ORDER — MIDAZOLAM HCL 2 MG/2ML IJ SOLN: 1.0000 mg | INTRAMUSCULAR | Status: DC | PRN

## 2017-11-25 MED ORDER — ALTEPLASE (STROKE) FULL DOSE INFUSION
90.0000 mg | Freq: Once | INTRAVENOUS | Status: AC
  Administered 2017-11-25: 90 mg via INTRAVENOUS
  Filled 2017-11-25: qty 100

## 2017-11-25 MED ORDER — CEFAZOLIN SODIUM-DEXTROSE 2-3 GM-%(50ML) IV SOLR
INTRAVENOUS | Status: DC | PRN
  Administered 2017-11-25: 2 g via INTRAVENOUS

## 2017-11-25 MED ORDER — STROKE: EARLY STAGES OF RECOVERY BOOK
Freq: Once | Status: DC
  Filled 2017-11-25: qty 1

## 2017-11-25 MED ORDER — ONDANSETRON HCL 4 MG/2ML IJ SOLN
INTRAMUSCULAR | Status: DC | PRN
  Administered 2017-11-25: 4 mg via INTRAVENOUS

## 2017-11-25 MED ORDER — PROPOFOL 1000 MG/100ML IV EMUL
INTRAVENOUS | Status: AC
  Administered 2017-11-25: 20:00:00
  Filled 2017-11-25: qty 100

## 2017-11-25 MED ORDER — IOPAMIDOL (ISOVUE-300) INJECTION 61%
INTRAVENOUS | Status: AC
  Administered 2017-11-25: 85 mL
  Filled 2017-11-25: qty 150

## 2017-11-25 MED ORDER — LABETALOL HCL 5 MG/ML IV SOLN
10.0000 mg | INTRAVENOUS | Status: DC | PRN
  Administered 2017-11-25: 10 mg via INTRAVENOUS
  Filled 2017-11-25 (×2): qty 4

## 2017-11-25 MED ORDER — SUCCINYLCHOLINE CHLORIDE 20 MG/ML IJ SOLN
INTRAMUSCULAR | Status: DC | PRN
  Administered 2017-11-25: 100 mg via INTRAVENOUS

## 2017-11-25 MED ORDER — INSULIN ASPART 100 UNIT/ML ~~LOC~~ SOLN: 0.0000 [IU] | Freq: Three times a day (TID) | SUBCUTANEOUS | Status: DC

## 2017-11-25 MED ORDER — MIDAZOLAM HCL 2 MG/2ML IJ SOLN
1.0000 mg | INTRAMUSCULAR | Status: DC | PRN
  Administered 2017-11-25: 1 mg via INTRAVENOUS
  Filled 2017-11-25: qty 2

## 2017-11-25 MED ORDER — PHENYLEPHRINE HCL 10 MG/ML IJ SOLN
INTRAVENOUS | Status: DC | PRN
  Administered 2017-11-25: 25 ug/min via INTRAVENOUS

## 2017-11-25 MED ORDER — INSULIN ASPART 100 UNIT/ML ~~LOC~~ SOLN
0.0000 [IU] | SUBCUTANEOUS | Status: DC
  Administered 2017-11-25 – 2017-11-27 (×10): 3 [IU] via SUBCUTANEOUS
  Administered 2017-11-27: 2 [IU] via SUBCUTANEOUS
  Administered 2017-11-27 (×2): 3 [IU] via SUBCUTANEOUS
  Administered 2017-11-27: 5 [IU] via SUBCUTANEOUS
  Administered 2017-11-28 (×2): 3 [IU] via SUBCUTANEOUS
  Administered 2017-11-28: 2 [IU] via SUBCUTANEOUS
  Administered 2017-11-28: 3 [IU] via SUBCUTANEOUS
  Administered 2017-11-28: 2 [IU] via SUBCUTANEOUS
  Administered 2017-11-28: 5 [IU] via SUBCUTANEOUS
  Administered 2017-11-29: 3 [IU] via SUBCUTANEOUS
  Administered 2017-11-29: 2 [IU] via SUBCUTANEOUS
  Administered 2017-11-29: 3 [IU] via SUBCUTANEOUS
  Administered 2017-11-29: 2 [IU] via SUBCUTANEOUS
  Administered 2017-11-29 – 2017-11-30 (×2): 3 [IU] via SUBCUTANEOUS
  Administered 2017-11-30 (×2): 2 [IU] via SUBCUTANEOUS
  Administered 2017-11-30: 3 [IU] via SUBCUTANEOUS
  Administered 2017-11-30: 8 [IU] via SUBCUTANEOUS
  Administered 2017-11-30: 3 [IU] via SUBCUTANEOUS
  Administered 2017-12-01: 11 [IU] via SUBCUTANEOUS
  Administered 2017-12-01: 8 [IU] via SUBCUTANEOUS
  Administered 2017-12-01: 15 [IU] via SUBCUTANEOUS
  Administered 2017-12-01 (×3): 5 [IU] via SUBCUTANEOUS
  Administered 2017-12-02 (×2): 11 [IU] via SUBCUTANEOUS
  Administered 2017-12-02: 15 [IU] via SUBCUTANEOUS

## 2017-11-25 MED ORDER — IOPAMIDOL (ISOVUE-370) INJECTION 76%
INTRAVENOUS | Status: AC
  Administered 2017-11-25: 90 mL
  Filled 2017-11-25: qty 100

## 2017-11-25 MED ORDER — LACTATED RINGERS IV SOLN
INTRAVENOUS | Status: DC | PRN
  Administered 2017-11-25: 16:00:00 via INTRAVENOUS

## 2017-11-25 MED ORDER — SODIUM CHLORIDE 0.9 % IV SOLN
INTRAVENOUS | Status: DC
  Administered 2017-11-26 – 2017-11-28 (×4): via INTRAVENOUS

## 2017-11-25 MED ORDER — PANTOPRAZOLE SODIUM 40 MG IV SOLR
40.0000 mg | Freq: Every day | INTRAVENOUS | Status: DC
  Administered 2017-11-25 – 2017-11-28 (×4): 40 mg via INTRAVENOUS
  Filled 2017-11-25 (×4): qty 40

## 2017-11-25 MED ORDER — CEFAZOLIN SODIUM-DEXTROSE 2-4 GM/100ML-% IV SOLN
INTRAVENOUS | Status: AC
  Filled 2017-11-25: qty 100

## 2017-11-25 MED ORDER — ORAL CARE MOUTH RINSE
15.0000 mL | OROMUCOSAL | Status: DC
  Administered 2017-11-25 – 2017-11-26 (×11): 15 mL via OROMUCOSAL

## 2017-11-25 MED ORDER — FENTANYL CITRATE (PF) 100 MCG/2ML IJ SOLN
INTRAMUSCULAR | Status: DC | PRN
  Administered 2017-11-25 (×2): 50 ug via INTRAVENOUS

## 2017-11-25 MED ORDER — EPHEDRINE SULFATE 50 MG/ML IJ SOLN
INTRAMUSCULAR | Status: DC | PRN
  Administered 2017-11-25: 10 mg via INTRAVENOUS

## 2017-11-25 MED ORDER — SODIUM CHLORIDE 0.9 % IV SOLN: INTRAVENOUS | Status: DC

## 2017-11-25 MED ORDER — EPTIFIBATIDE 20 MG/10ML IV SOLN
INTRAVENOUS | Status: AC
  Filled 2017-11-25: qty 10

## 2017-11-25 MED ORDER — FENTANYL 2500MCG IN NS 250ML (10MCG/ML) PREMIX INFUSION
25.0000 ug/h | INTRAVENOUS | Status: DC
  Administered 2017-11-25: 100 ug/h via INTRAVENOUS
  Administered 2017-11-26: 150 ug/h via INTRAVENOUS
  Filled 2017-11-25 (×2): qty 250

## 2017-11-25 MED ORDER — NITROGLYCERIN 1 MG/10 ML FOR IR/CATH LAB
INTRA_ARTERIAL | Status: AC
  Filled 2017-11-25: qty 10

## 2017-11-25 MED ORDER — FENTANYL BOLUS VIA INFUSION
25.0000 ug | INTRAVENOUS | Status: DC | PRN
  Filled 2017-11-25: qty 25

## 2017-11-25 MED ORDER — ASPIRIN 81 MG PO CHEW
CHEWABLE_TABLET | ORAL | Status: AC
  Filled 2017-11-25: qty 1

## 2017-11-25 MED ORDER — IOPAMIDOL (ISOVUE-300) INJECTION 61%
INTRAVENOUS | Status: AC
  Administered 2017-11-25: 5 mL
  Filled 2017-11-25: qty 50

## 2017-11-25 MED ORDER — CHLORHEXIDINE GLUCONATE 0.12% ORAL RINSE (MEDLINE KIT)
15.0000 mL | Freq: Two times a day (BID) | OROMUCOSAL | Status: DC
  Administered 2017-11-25 – 2017-11-26 (×3): 15 mL via OROMUCOSAL

## 2017-11-25 MED ORDER — NOREPINEPHRINE BITARTRATE 1 MG/ML IV SOLN
0.0000 ug/min | INTRAVENOUS | Status: DC
  Administered 2017-11-25: 2 ug/min via INTRAVENOUS
  Filled 2017-11-25 (×2): qty 4

## 2017-11-25 MED ORDER — ACETAMINOPHEN 160 MG/5ML PO SOLN
650.0000 mg | ORAL | Status: DC | PRN
  Administered 2017-12-02: 650 mg
  Filled 2017-11-25: qty 20.3

## 2017-11-25 MED ORDER — TICAGRELOR 90 MG PO TABS
ORAL_TABLET | ORAL | Status: AC
  Filled 2017-11-25: qty 2

## 2017-11-25 MED ORDER — ACETAMINOPHEN 325 MG PO TABS: 650.0000 mg | ORAL_TABLET | ORAL | Status: DC | PRN

## 2017-11-25 NOTE — ED Notes (Signed)
Dr. Corliss Skainseveshwar called pts nephew Todd [name], [phone], no answer, MD left message.

## 2017-11-25 NOTE — ED Provider Notes (Signed)
[name] EMERGENCY DEPARTMENT Provider Note   CSN: [id number] Arrival date & time: 12/04/2017  1439     History   Chief Complaint Chief Complaint  Patient presents with  . Code Stroke    HPI Todd [name] is a 72 y.o. male.  The history is provided by the patient.  Cerebrovascular Accident  This is a new problem. The current episode started 1 to 2 hours ago. The problem occurs constantly. The problem has not changed since onset.Associated symptoms comments: Unable to assess. Nothing relieves the symptoms. He has tried nothing for the symptoms.    Past Medical History:  Diagnosis Date  . 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 Carotid stenosis   . CHF (congestive heart failure) (HCC)   . Depression   . Diabetes mellitus without complication (HCC)   . Hyperlipidemia   . Hypertension   . Obesity   . Peripheral arterial disease (HCC)   . Polyuria     Patient Active Problem List   Diagnosis Date Noted  . Bilateral carotid artery stenosis 08/28/2016  . Atherosclerosis of native arteries of extremity with intermittent claudication (HCC) 07/09/2015    Past Surgical History:  Procedure Laterality Date  . CAROTID ENDARTERECTOMY         Home Medications    Prior to Admission medications   Medication Sig Start Date End Date Taking? Authorizing Provider  aspirin 81 MG tablet Take 81 mg by mouth daily.    [provider]  citalopram (CELEXA) 40 MG tablet Take 40 mg by mouth daily.    [provider]  CRESTOR 10 MG tablet  05/20/15   [provider]  HUMALOG MIX 75/25 KWIKPEN (75-25) 100 UNIT/ML Kwikpen  07/02/15   [provider]  HYDROcodone-acetaminophen (NORCO/VICODIN) 5-325 MG per tablet Reported on 02/11/2016 07/02/15   [provider]  insulin lispro protamine-lispro (HUMALOG 75/25 MIX) (75-25) 100 UNIT/ML SUSP injection Inject 57 Units into the skin 2 (two) times daily.     [provider]  losartan (COZAAR) 25 MG tablet Take 25 mg by mouth daily.    [provider]  methocarbamol (ROBAXIN) 500 MG tablet  07/02/15   [provider]  Oxycodone-Acetaminophen (PERCOCET PO) Take by mouth. Reported on 02/11/2016    [provider]  oxyCODONE-acetaminophen (PERCOCET/ROXICET) 5-325 MG per tablet Reported on 02/11/2016 07/06/15   [provider]  tamsulosin (FLOMAX) 0.4 MG CAPS capsule Take 0.4 mg by mouth daily.    [provider]  traMADol Janean Sark) 50 MG tablet  07/02/15   [provider]    Family History Family History  Problem Relation Age of Onset  . Peripheral vascular disease Father     Social History Social History   Tobacco Use  . Smoking status: Current Every Day Smoker    Types: Cigarettes  . Smokeless tobacco: Never Used  . Tobacco comment: Up 1 1/2 pks per day  Substance Use Topics  . Alcohol use: Yes    Alcohol/week: 4.2 oz    Types: 7 Glasses of wine per week  . Drug use: No     Allergies   Patient has no known allergies.   Review of Systems Review of Systems  Unable to perform ROS: Other (severe dysarthria from CVA)     Physical Exam Updated Vital Signs BP (!) 170/87   Pulse 71   Resp (!) 22   Wt 102 kg (224 lb  13.9 oz)   SpO2 93%   BMI 30.50 kg/m   Physical Exam  Constitutional: He appears well-developed and well-nourished.  HENT:  Head: Normocephalic and atraumatic.  Eyes: Conjunctivae are normal.  Neck: Neck supple.  Cardiovascular: Normal rate and regular rhythm.  No murmur heard. Pulmonary/Chest: Effort normal and breath sounds normal. No respiratory distress.  Abdominal: Soft. There is no tenderness.  Musculoskeletal: He exhibits no edema.  Neurological: He is alert.  Left gaze deviation, right facial droop, dysarthria, flaccid RUE, moving LUE & b/l LE  Skin: Skin is warm and dry.  Psychiatric: He has a normal mood and affect.  Nursing note and  vitals reviewed.    ED Treatments / Results  Labs (all labs ordered are listed, but only abnormal results are displayed) Labs Reviewed  COMPREHENSIVE METABOLIC PANEL - Abnormal; Notable for the following components:      Result Value   Potassium 3.3 (*)    Glucose, Bld 190 (*)    Albumin 3.4 (*)    All other components within normal limits  CBG MONITORING, ED - Abnormal; Notable for the following components:   Glucose-Capillary 188 (*)    All other components within normal limits  I-STAT CHEM 8, ED - Abnormal; Notable for the following components:   Potassium 3.4 (*)    Glucose, Bld 192 (*)    Calcium, Ion 1.14 (*)    All other components within normal limits  PROTIME-INR  APTT  CBC  DIFFERENTIAL  I-STAT TROPONIN, ED    EKG  EKG Interpretation None       Radiology Ct Head Code Stroke Wo Contrast  Result Date: 12/19/2017 CLINICAL DATA:  Code stroke. RIGHT-sided weakness and slurred speech. EXAM: CT HEAD WITHOUT CONTRAST TECHNIQUE: Contiguous axial images were obtained from the base of the skull through the vertex without intravenous contrast. COMPARISON:  None. FINDINGS: Brain: Subtle asymmetric hypoattenuation in the region of the LEFT posterior limb internal capsule could represent developing infarction or represent an old insult; see image 19 series 3. Otherwise no evidence for acute infarct, hemorrhage, mass lesion, hydrocephalus, or extra-axial fluid. Premature for age atrophy. Hypoattenuation of white matter of a relatively modest nature, could represent small vessel disease. 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 Vascular: Calcification of the cavernous internal carotid arteries and distal vertebral arteries, consistent with cerebrovascular atherosclerotic disease. No signs of intracranial large vessel occlusion. Skull: Calvarium intact. Sinuses/Orbits: No acute finding. Other: None ASPECTS (Alberta Stroke Program Early  CT Score) - Ganglionic level infarction (caudate, lentiform nuclei, internal capsule, insula, M1-M3 cortex): 6 - Supraganglionic infarction (M4-M6 cortex): 3 Total score (0-10 with 10 being normal): 9 IMPRESSION: 1. Atrophy and small vessel disease. Cannot exclude early LEFT posterior limb internal capsule infarct. Chronic insult LEFT cerebellum. 2. ASPECTS is 9. These results were communicated to Dr. Laurence SlateAroor At 3:12 pmon 01/21/2019by text page via the [facility] messaging system. Electronically Signed   By: Elsie StainJohn T Curnes M.D.   On: 12/15/2017 15:15    Procedures Procedures (including critical care time)  Medications Ordered in ED Medications  alteplase (ACTIVASE) 1 mg/mL infusion 90 mg (not administered)  labetalol (NORMODYNE,TRANDATE) injection 10 mg (not administered)  iopamidol (ISOVUE-370) 76 % injection (90 mLs  Contrast Given 12/06/2017 1500)     Initial Impression / Assessment and Plan / ED Course  I have reviewed the triage vital signs and the nursing notes.  Pertinent labs & imaging results that were available during my care  of the patient were reviewed by me and considered in my medical decision making (see chart for details).     Pt with h/o CAD, CHF, PAD, DM, HTN, HLD presents as a CODE STROKE. LKN 1300. EMS reports that the Pt lives alone, and fell at 1300, and then was too weak to get up on his own.  VS & exam as above. Labs & imaging ordered per protocol.   EKG: NSR @ 71bpm w/prolonged QTc ( ) and no signs of ischemia.   tPA infusion started by Neurology while in CT.   Neurology & Radiology discussed the Pt's images & determined that he is a candidate for thrombectomy. Pt will go to IR for intervention and be admitted to the Neuro ICU for further evaluation and treatment after his procedure.  Final Clinical Impressions(s) / ED Diagnoses   Final diagnoses:  Cerebrovascular accident (CVA), unspecified mechanism [facility])    ED Discharge Orders    None       Forest Becker,  MD 11/30/2017 1604    Little, Ambrose Finland, MD 11/26/17 2020

## 2017-11-25 NOTE — ED Notes (Signed)
TPA signed off with pharmacist

## 2017-11-25 NOTE — Anesthesia Procedure Notes (Signed)
Procedure Name: Intubation Date/Time: 12/10/2017 4:31 PM Performed by: Clearnce Sorrel, CRNA Pre-anesthesia Checklist: Patient identified, Emergency Drugs available, Suction available, Patient being monitored and Timeout performed Patient Re-evaluated:Patient Re-evaluated prior to induction Oxygen Delivery Method: Circle system utilized Preoxygenation: Pre-oxygenation with 100% oxygen Induction Type: IV induction, Rapid sequence and Cricoid Pressure applied Laryngoscope Size: Mac, 4 and McGraph Grade View: Grade II Tube type: Subglottic suction tube Tube size: 7.5 mm Number of attempts: 2 Airway Equipment and Method: Stylet Placement Confirmation: ETT inserted through vocal cords under direct vision,  positive ETCO2 and breath sounds checked- equal and bilateral Secured at: 23 cm Tube secured with: Tape Dental Injury: Teeth and Oropharynx as per pre-operative assessment

## 2017-11-25 NOTE — ED Notes (Signed)
Arrived by EMS.  Pt reports he fell at 1pm and could not get self up.  Presents with right sided weakness, right arm drift, right facial droop, slurred speech.  Bilateral LE equal.  BP 160/90 HR 72 BS 220 RR 18

## 2017-11-25 NOTE — Anesthesia Postprocedure Evaluation (Signed)
Anesthesia Post Note  Patient: Todd [name]  Procedure(s) Performed: RADIOLOGY WITH ANESTHESIA (N/A )     Patient location during evaluation: SICU Anesthesia Type: General Level of consciousness: sedated Pain management: pain level controlled Vital Signs Assessment: post-procedure vital signs reviewed and stable Respiratory status: patient remains intubated per anesthesia plan Cardiovascular status: stable Postop Assessment: no apparent nausea or vomiting Anesthetic complications: no    Last Vitals:  Vitals:   04/25/18 1825 04/25/18 1830  BP:  (!) 168/85  Pulse: (!) 42 (!) 45  Resp: 13 17  SpO2: 100% 100%    Last Pain: There were no vitals filed for this visit.               Kennieth RadFitzgerald, Ariyah Sedlack E

## 2017-11-25 NOTE — Sedation Documentation (Signed)
R groin and distal pulses assessed at bedside with Herbert SetaHeather, RN. All questions answered.

## 2017-11-25 NOTE — Sedation Documentation (Signed)
Anesthesia case 

## 2017-11-25 NOTE — ED Notes (Signed)
Report to IR team

## 2017-11-25 NOTE — Progress Notes (Addendum)
S/O: The patient is intubated and sedated on propofol following catheter-based cerebral angiogram.   BP 104/85   Pulse (!) 56   Resp (!) 26   Wt 102 kg (224 lb 13.9 oz)   SpO2 99%   BMI 30.50 kg/m   Exam consistent with sedation on propofol. Intubated and resting without evidence for pain or agitation. Not responding to commands.   STAT CT head negative for hemorrhage (preliminary review). Await official Radiology report.  A/R: 1. Will obtain STAT MRI brain 2. Discussed the patient's presentation and results of angiogram with patient's nephew, who is his power of attorney.  3. Neurology team will continue to follow together with CCM.   Addendum:  MRI brain completed 1. Acute/early subacute infarction involving left posterior limb of internal capsule extending into left anterior parahippocampal gyrus and hippocampus. Additional punctate infarct within left inferior cerebellar hemisphere. No hemorrhage or mass effect. 2. Background of mild chronic microvascular ischemic changes and mild parenchymal volume loss of the brain. 3. 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 4. Mild paranasal sinus disease.  Electronically signed: Dr. Caryl PinaEric Linken Mcglothen

## 2017-11-25 NOTE — Anesthesia Procedure Notes (Addendum)
Arterial Line Insertion Start/End01/01/2018 4:33 PM, 11/25/2017 4:38 PM Performed by: Marcene DuosFitzgerald, Montague Corella, MD, anesthesiologist  Patient location: Pre-op. Preanesthetic checklist: patient identified, IV checked, site marked, risks and benefits discussed, surgical consent, monitors and equipment checked, pre-op evaluation, timeout performed and anesthesia consent Lidocaine 1% used for infiltration Left, radial was placed Catheter size: 20 Fr Hand hygiene performed  and maximum sterile barriers used   Attempts: 1 Procedure performed without using ultrasound guided technique. Following insertion, dressing applied and Biopatch. Post procedure assessment: normal and unchanged  Patient tolerated the procedure well with no immediate complications.

## 2017-11-25 NOTE — Consult Note (Signed)
PULMONARY / CRITICAL CARE MEDICINE   Name: Todd [name] MRN: [id number] DOB: January 22, 1946    ADMISSION DATE:  12/05/2017 CONSULTATION DATE: 12-05-17   CHIEF COMPLAINT:  Stroke Alert  HISTORY OF PRESENT ILLNESS:        This is a 72 year old diabetic hypertensive who presented with left gaze deviation and right facial droop who received TPA in the ED. CTA suggested a left M2 occlusion, and he was taken to angiography where no occlusion was found following the tpa. He arrived in the ICU hypertensive. He was given labetalol and became bradycardic into the 40's. Sedation was held to see if bradycardia might be secondary to an intracranial catastrophe. He was following instructions but not moving the right side.   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  PAST SURGICAL HISTORY: He  has a past surgical history that includes Carotid endarterectomy.  No Known Allergies  No current facility-administered medications on file prior to encounter.    Current Outpatient Medications on File Prior to Encounter  Medication Sig  . aspirin 81 MG tablet Take 81 mg by mouth daily.  . citalopram (CELEXA) 40 MG tablet Take 40 mg by mouth daily.  . CRESTOR 10 MG tablet   . HUMALOG MIX 75/25 KWIKPEN (75-25) 100 UNIT/ML Kwikpen   . HYDROcodone-acetaminophen (NORCO/VICODIN) 5-325 MG per tablet Reported on 02/11/2016  . insulin lispro protamine-lispro (HUMALOG 75/25 MIX) (75-25) 100 UNIT/ML SUSP injection Inject 57 Units into the skin 2 (two) times daily.  Marland Kitchen losartan (COZAAR) 25 MG tablet Take 25 mg by mouth daily.  . methocarbamol (ROBAXIN) 500 MG tablet   . Oxycodone-Acetaminophen (PERCOCET PO) Take by mouth. Reported on 02/11/2016  .  oxyCODONE-acetaminophen (PERCOCET/ROXICET) 5-325 MG per tablet Reported on 02/11/2016  . tamsulosin (FLOMAX) 0.4 MG CAPS capsule Take 0.4 mg by mouth daily.  . traMADol (ULTRAM) 50 MG tablet     FAMILY HISTORY:  His indicated that the status of his father is unknown.   SOCIAL HISTORY: He  reports that he has been smoking cigarettes.  he has never used smokeless tobacco. He reports that he drinks about 4.2 oz of alcohol per week. He reports that he does not use drugs.  REVIEW OF SYSTEMS:   unobtainable  SUBJECTIVE:  unobtainable  VITAL SIGNS: BP (!) 168/85   Pulse (!) 45   Resp 17   Wt 224 lb 13.9 oz (102 kg)   SpO2 100%   BMI 30.50 kg/m   HEMODYNAMICS:    VENTILATOR SETTINGS: Vent Mode: CPAP;PSV FiO2 (%):  [40 %] 40 % Set Rate:  [17 bmp] 17 bmp PEEP:  [5 cmH20] 5 cmH20 Pressure Support:  [10 cmH20] 10 cmH20  INTAKE / OUTPUT: No intake/output data recorded.  PHYSICAL EXAMINATION: General:  Obese male, orally intubated and in no distress Neuro: Follows instructions moving left limbs not right. Pupils equal, left gaze deviation. Cardiovascular:  S1 S2 regular without m g or rub Lungs:  Unlabored, symmetric air movement. No wheezes Abdomen:  Obese, soft, non-tender, quiet Musculoskeletal:  Feet pink and warm   LABS:  BMET Recent Labs  Lab 12-05-17 1441 12/05/2017 1454  NA 135 139  K 3.3* 3.4*  CL 102 102  CO2 25  --   BUN 13 14  CREATININE 0.76 0.70  GLUCOSE 190* 192*    Electrolytes Recent Labs  Lab 2018/11/03 1441  CALCIUM 9.5    CBC Recent Labs  Lab 2018/11/03 1441 2018/11/03 1454  WBC 10.1  --   HGB 15.3 15.3  HCT 44.8 45.0  PLT 248  --     Coag's Recent Labs  Lab 2018/11/03 1441  APTT 26  INR 1.10    Sepsis Markers No results for input(s): LATICACIDVEN, PROCALCITON, O2SATVEN in the last 168 hours.  ABG No results for input(s): PHART, PCO2ART, PO2ART in the last 168 hours.  Liver Enzymes Recent Labs  Lab 2018/11/03 1441  AST 20   ALT 19  ALKPHOS 73  BILITOT 0.7  ALBUMIN 3.4*    Cardiac Enzymes No results for input(s): TROPONINI, PROBNP in the last 168 hours.  Glucose Recent Labs  Lab 2018/11/03 1442  GLUCAP 188*    Imaging Ct Angio Head W Or Wo Contrast  Result Date: 11/23/2017 CLINICAL DATA:  Right-sided weakness and slurred speech. EXAM: CT ANGIOGRAPHY HEAD AND NECK CT PERFUSION BRAIN TECHNIQUE: Multidetector CT imaging of the head and neck was performed using the standard protocol during bolus administration of intravenous contrast. Multiplanar CT image reconstructions and MIPs were obtained to evaluate the vascular anatomy. Carotid stenosis measurements (when applicable) are obtained utilizing NASCET criteria, using the distal internal carotid diameter as the denominator. Multiphase CT imaging of the brain was performed following IV bolus contrast injection. Subsequent parametric perfusion maps were calculated using RAPID software. CONTRAST:  90mL ISOVUE-370 IOPAMIDOL (ISOVUE-370) INJECTION 76% COMPARISON:  Chest CT 07/06/2015. FINDINGS: CTA NECK FINDINGS Aortic arch: Normal variant 4 vessel aortic arch with the left vertebral artery arising just proximal to the left subclavian artery. Widely patent arch vessel origins. Mild nonstenotic plaque in both subclavian arteries. Right carotid system: Patent with evidence of prior endarterectomy. Mild luminal irregularity of the common carotid artery and carotid bulb without significant stenosis. Left carotid system: Patent with mild, predominantly calcified plaque about the carotid bifurcation without evidence of significant stenosis or dissection. Vertebral arteries: Patent with the right being dominant. Calcified plaque near both vertebral artery origins without associated stenosis. Skeleton: Mild cervical disc and facet degeneration. Other neck: No mass or lymph node enlargement. Upper chest: Moderate-sized, loculated right pleural effusion with mild pleural thickening,  incompletely imaged. Associated compressive right lung atelectasis. Mild centrilobular emphysema. 12 x 9 mm part solid left upper lobe lung nodule (series 7, image 171), with only faint ground-glass density on the prior chest CT. Review of the MIP images confirms the above findings CTA HEAD FINDINGS Anterior circulation: The internal carotid arteries are patent from skullbase to carotid termini with extensive siphon atherosclerosis bilaterally resulting in moderate to severe left and moderate right cavernous stenoses and moderate to severe right and moderate left para clinoid stenoses. The left M1 segment and MCA bifurcation are widely patent, however there is occlusion of the M2 superior division just beyond its origin. There is reconstitution of a diseased superior division branch vessel anteriorly in the sylvian fissure, however there is a decreased number of superior division distal branch vessels overall compared to the contralateral side. The right MCA and both ACAs are patent with moderate branch vessel atherosclerotic irregular narrowing but no evidence of proximal branch occlusion or significant proximal stenosis. No aneurysm. Posterior circulation: Both V4 segments are small and partially obscured by metallic dental streak artifact, particularly on the left. The left V4 segment is occluded distal to the PICA origin. The right V4  segment is irregular with possible multiple severe stenoses. Right PICA origin is patent. The basilar artery is patent but extremely hypoplastic diffusely. There are fetal type origins of both PCAs, and there is a severe stenosis in the left P1-P2 junction region. There are also moderate tandem proximal left P2 stenoses, and there is asymmetric attenuation of distal left PCA branch vessels compared to the right. No significant proximal right PCA stenosis is seen. No aneurysm. Venous sinuses: Grossly patent. Anatomic variants: Fetal origins of the PCAs. Delayed phase: Not performed.  Review of the MIP images confirms the above findings CT Brain Perfusion Findings: CBF (<30%) Volume: 0mL Perfusion (Tmax>6.0s) volume: 0mL (small amount of likely artifactual apparent prolonged transit in the cerebellum) Mismatch Volume: n/a Infarction Location: n/a IMPRESSION: 1. Proximal left M2 superior division occlusion with partial distal reconstitution. 2. Extensive ICA siphon atherosclerosis with moderate to severe stenoses bilaterally. 3. 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 Occlusion of the nondominant left vertebral artery distal to PICA. Possible severe right V4 stenoses, not well evaluated. 4. Moderate to severe proximal left PCA stenoses. 5. Patent cervical carotid arteries without significant stenosis. Prior right carotid endarterectomy. 6. Aortic Atherosclerosis (ICD10-I70.0) and Emphysema (ICD10-J43.9). 7. Moderate-sized, loculated right pleural effusion with pleural thickening suggesting chronicity. 8. 11 mm part solid pulmonary nodule in the left upper lobe. Follow-up non-contrast CT recommended at 3-6 months to confirm persistence. If unchanged, and solid component remains <6 mm, annual CT is recommended until 5 years of stability has been established. If persistent these nodules should be considered highly suspicious if the solid component of the nodule is 6 mm or greater in size and enlarging. This recommendation follows the consensus statement: Guidelines for Management of Incidental Pulmonary Nodules Detected on CT Images: From the Fleischner Society 2017; Radiology 2017; 284:228-243. Study reviewed in person with Dr. Laurence Slate on 11/23/2017 at 3:27 p.m. Electronically Signed   By: Sebastian Ache M.D.   On: 12/08/2017 16:13   Ct Angio Neck W Or Wo Contrast  Result Date: 12/02/2017 CLINICAL DATA:  Right-sided weakness and slurred speech. EXAM: CT ANGIOGRAPHY HEAD AND NECK CT PERFUSION BRAIN TECHNIQUE: Multidetector CT imaging of the head and neck was  performed using the standard protocol during bolus administration of intravenous contrast. Multiplanar CT image reconstructions and MIPs were obtained to evaluate the vascular anatomy. Carotid stenosis measurements (when applicable) are obtained utilizing NASCET criteria, using the distal internal carotid diameter as the denominator. Multiphase CT imaging of the brain was performed following IV bolus contrast injection. Subsequent parametric perfusion maps were calculated using RAPID software. CONTRAST:  90mL ISOVUE-370 IOPAMIDOL (ISOVUE-370) INJECTION 76% COMPARISON:  Chest CT 07/06/2015. FINDINGS: CTA NECK FINDINGS Aortic arch: Normal variant 4 vessel aortic arch with the left vertebral artery arising just proximal to the left subclavian artery. Widely patent arch vessel origins. Mild nonstenotic plaque in both subclavian arteries. Right carotid system: Patent with evidence of prior endarterectomy. Mild luminal irregularity of the common carotid artery and carotid bulb without significant stenosis. Left carotid system: Patent with mild, predominantly calcified plaque about the carotid bifurcation without evidence of significant stenosis or dissection. Vertebral arteries: Patent with the right being dominant. Calcified plaque near both vertebral artery origins without associated stenosis. Skeleton: Mild cervical disc and facet degeneration. Other neck: No mass or lymph node enlargement. Upper chest: Moderate-sized, loculated right pleural effusion with mild pleural thickening, incompletely imaged. Associated compressive right lung atelectasis. Mild centrilobular emphysema. 12 x 9 mm part solid left upper lobe  lung nodule (series 7, image 171), with only faint ground-glass density on the prior chest CT. Review of the MIP images confirms the above findings CTA HEAD FINDINGS Anterior circulation: The internal carotid arteries are patent from skullbase to carotid termini with extensive siphon atherosclerosis  bilaterally resulting in moderate to severe left and moderate right cavernous stenoses and moderate to severe right and moderate left para clinoid stenoses. The left M1 segment and MCA bifurcation are widely patent, however there is occlusion of the M2 superior division just beyond its origin. There is reconstitution of a diseased superior division branch vessel anteriorly in the sylvian fissure, however there is a decreased number of superior division distal branch vessels overall compared to the contralateral side. The right MCA and both ACAs are patent with moderate branch vessel atherosclerotic irregular narrowing but no evidence of proximal branch occlusion or significant proximal stenosis. No aneurysm. Posterior circulation: Both V4 segments are small and partially obscured by metallic dental streak artifact, particularly on the left. The left V4 segment is occluded distal to the PICA origin. The right V4 segment is irregular with possible multiple severe stenoses. Right PICA origin is patent. The basilar artery is patent but extremely hypoplastic diffusely. There are fetal type origins of both PCAs, and there is a severe stenosis in the left P1-P2 junction region. There are also moderate tandem proximal left P2 stenoses, and there is asymmetric attenuation of distal left PCA branch vessels compared to the right. No significant proximal right PCA stenosis is seen. No aneurysm. Venous sinuses: Grossly patent. Anatomic variants: Fetal origins of the PCAs. Delayed phase: Not performed. Review of the MIP images confirms the above findings CT Brain Perfusion Findings: CBF (<30%) Volume: 0mL Perfusion (Tmax>6.0s) volume: 0mL (small amount of likely artifactual apparent prolonged transit in the cerebellum) Mismatch Volume: n/a Infarction Location: n/a IMPRESSION: 1. Proximal left M2 superior division occlusion with partial distal reconstitution. 2. Extensive ICA siphon atherosclerosis with moderate to severe stenoses  bilaterally. 3. 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 Occlusion of the nondominant left vertebral artery distal to PICA. Possible severe right V4 stenoses, not well evaluated. 4. Moderate to severe proximal left PCA stenoses. 5. Patent cervical carotid arteries without significant stenosis. Prior right carotid endarterectomy. 6. Aortic Atherosclerosis (ICD10-I70.0) and Emphysema (ICD10-J43.9). 7. Moderate-sized, loculated right pleural effusion with pleural thickening suggesting chronicity. 8. 11 mm part solid pulmonary nodule in the left upper lobe. Follow-up non-contrast CT recommended at 3-6 months to confirm persistence. If unchanged, and solid component remains <6 mm, annual CT is recommended until 5 years of stability has been established. If persistent these nodules should be considered highly suspicious if the solid component of the nodule is 6 mm or greater in size and enlarging. This recommendation follows the consensus statement: Guidelines for Management of Incidental Pulmonary Nodules Detected on CT Images: From the Fleischner Society 2017; Radiology 2017; 284:228-243. Study reviewed in person with Dr. Laurence Slate on 12/05/2017 at 3:27 p.m. Electronically Signed   By: Sebastian Ache M.D.   On: 11/21/2017 16:13   Ct Cerebral Perfusion W Contrast  Result Date: 12/17/2017 CLINICAL DATA:  Right-sided weakness and slurred speech. EXAM: CT ANGIOGRAPHY HEAD AND NECK CT PERFUSION BRAIN TECHNIQUE: Multidetector CT imaging of the head and neck was performed using the standard protocol during bolus administration of intravenous contrast. Multiplanar CT image reconstructions and MIPs were obtained to evaluate the vascular anatomy. Carotid stenosis measurements (when applicable) are obtained utilizing NASCET criteria, using the distal internal  carotid diameter as the denominator. Multiphase CT imaging of the brain was performed following IV bolus contrast injection. Subsequent  parametric perfusion maps were calculated using RAPID software. CONTRAST:  90mL ISOVUE-370 IOPAMIDOL (ISOVUE-370) INJECTION 76% COMPARISON:  Chest CT 07/06/2015. FINDINGS: CTA NECK FINDINGS Aortic arch: Normal variant 4 vessel aortic arch with the left vertebral artery arising just proximal to the left subclavian artery. Widely patent arch vessel origins. Mild nonstenotic plaque in both subclavian arteries. Right carotid system: Patent with evidence of prior endarterectomy. Mild luminal irregularity of the common carotid artery and carotid bulb without significant stenosis. Left carotid system: Patent with mild, predominantly calcified plaque about the carotid bifurcation without evidence of significant stenosis or dissection. Vertebral arteries: Patent with the right being dominant. Calcified plaque near both vertebral artery origins without associated stenosis. Skeleton: Mild cervical disc and facet degeneration. Other neck: No mass or lymph node enlargement. Upper chest: Moderate-sized, loculated right pleural effusion with mild pleural thickening, incompletely imaged. Associated compressive right lung atelectasis. Mild centrilobular emphysema. 12 x 9 mm part solid left upper lobe lung nodule (series 7, image 171), with only faint ground-glass density on the prior chest CT. Review of the MIP images confirms the above findings CTA HEAD FINDINGS Anterior circulation: The internal carotid arteries are patent from skullbase to carotid termini with extensive siphon atherosclerosis bilaterally resulting in moderate to severe left and moderate right cavernous stenoses and moderate to severe right and moderate left para clinoid stenoses. The left M1 segment and MCA bifurcation are widely patent, however there is occlusion of the M2 superior division just beyond its origin. There is reconstitution of a diseased superior division branch vessel anteriorly in the sylvian fissure, however there is a decreased number of  superior division distal branch vessels overall compared to the contralateral side. The right MCA and both ACAs are patent with moderate branch vessel atherosclerotic irregular narrowing but no evidence of proximal branch occlusion or significant proximal stenosis. No aneurysm. Posterior circulation: Both V4 segments are small and partially obscured by metallic dental streak artifact, particularly on the left. The left V4 segment is occluded distal to the PICA origin. The right V4 segment is irregular with possible multiple severe stenoses. Right PICA origin is patent. The basilar artery is patent but extremely hypoplastic diffusely. There are fetal type origins of both PCAs, and there is a severe stenosis in the left P1-P2 junction region. There are also moderate tandem proximal left P2 stenoses, and there is asymmetric attenuation of distal left PCA branch vessels compared to the right. No significant proximal right PCA stenosis is seen. No aneurysm. Venous sinuses: Grossly patent. Anatomic variants: Fetal origins of the PCAs. Delayed phase: Not performed. Review of the MIP images confirms the above findings CT Brain Perfusion Findings: CBF (<30%) Volume: 0mL Perfusion (Tmax>6.0s) volume: 0mL (small amount of likely artifactual apparent prolonged transit in the cerebellum) Mismatch Volume: n/a Infarction Location: n/a IMPRESSION: 1. Proximal left M2 superior division occlusion with partial distal reconstitution. 2. Extensive ICA siphon atherosclerosis with moderate to severe stenoses bilaterally. 3. 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 Occlusion of the nondominant left vertebral artery distal to PICA. Possible severe right V4 stenoses, not well evaluated. 4. Moderate to severe proximal left PCA stenoses. 5. Patent cervical carotid arteries without significant stenosis. Prior right carotid endarterectomy. 6. Aortic Atherosclerosis (ICD10-I70.0) and Emphysema (ICD10-J43.9). 7.  Moderate-sized, loculated right pleural effusion with pleural thickening suggesting chronicity. 8. 11 mm part solid pulmonary nodule in the left  upper lobe. Follow-up non-contrast CT recommended at 3-6 months to confirm persistence. If unchanged, and solid component remains <6 mm, annual CT is recommended until 5 years of stability has been established. If persistent these nodules should be considered highly suspicious if the solid component of the nodule is 6 mm or greater in size and enlarging. This recommendation follows the consensus statement: Guidelines for Management of Incidental Pulmonary Nodules Detected on CT Images: From the Fleischner Society 2017; Radiology 2017; 284:228-243. Study reviewed in person with Dr. Laurence Slate on 12/11/2017 at 3:27 p.m. Electronically Signed   By: Sebastian Ache M.D.   On: 12/16/2017 16:13   Ct Head Code Stroke Wo Contrast  Result Date: 11/23/2017 CLINICAL DATA:  Code stroke. RIGHT-sided weakness and slurred speech. EXAM: CT HEAD WITHOUT CONTRAST TECHNIQUE: Contiguous axial images were obtained from the base of the skull through the vertex without intravenous contrast. COMPARISON:  None. FINDINGS: Brain: Subtle asymmetric hypoattenuation in the region of the LEFT posterior limb internal capsule could represent developing infarction or represent an old insult; see image 19 series 3. Otherwise no evidence for acute infarct, hemorrhage, mass lesion, hydrocephalus, or extra-axial fluid. Premature for age atrophy. Hypoattenuation of white matter of a relatively modest nature, could represent small vessel disease. 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 Vascular: Calcification of the cavernous internal carotid arteries and distal vertebral arteries, consistent with cerebrovascular atherosclerotic disease. No signs of intracranial large vessel occlusion. Skull: Calvarium intact. Sinuses/Orbits: No acute finding. Other: None ASPECTS  ([place] Stroke Program Early CT Score) - Ganglionic level infarction (caudate, lentiform nuclei, internal capsule, insula, M1-M3 cortex): 6 - Supraganglionic infarction (M4-M6 cortex): 3 Total score (0-10 with 10 being normal): 9 IMPRESSION: 1. Atrophy and small vessel disease. Cannot exclude early LEFT posterior limb internal capsule infarct. Chronic insult LEFT cerebellum. 2. ASPECTS is 9. These results were communicated to Dr. Laurence Slate At 3:12 pmon 01/20/2019by text page via the [facility] messaging system. Electronically Signed   By: Elsie Stain M.D.   On: 12/04/2017 15:15     STUDIES:    DISCUSSION:      72 year old hypertensive diabetic with bradycardia following tpa for CVA. Suspect bradycardia was secondary to labetalol but will R/O bleed with mass effect   ASSESSMENT / PLAN:  PULMONARY A: intubated for airway protection.   CARDIOVASCULAR A: Bradycardia as noted. Avoid labetalol. Use cardene to keep SBP less than 180 post tpa . There are bilateral SFA occlusions to be followed up by vascular   GASTROINTESTINAL A: PPI prophylaxis for now   ENDOCRINE A: sliding scale insulin ordered  Greater than 32 minutes was spent in the care of this patient today.     Penny Pia, MD Pulmonary and Critical Care Medicine [facility] Pager: [phone]  11/25/2017, 6:56 PM

## 2017-11-25 NOTE — Anesthesia Preprocedure Evaluation (Addendum)
Anesthesia Evaluation  Patient identified by MRN, date of birth, ID band Patient confused    Reviewed: Allergy & Precautions, Patient's Chart, lab work & pertinent test resultsPreop documentation limited or incomplete due to emergent nature of procedure.  Airway Mallampati: III  TM Distance: >3 FB Neck ROM: Full    Dental  (+) Dental Advisory Given   Pulmonary Current Smoker,    breath sounds clear to auscultation       Cardiovascular hypertension, Pt. on medications + CAD, + Peripheral Vascular Disease and +CHF   Rhythm:Regular Rate:Normal     Neuro/Psych CVA    GI/Hepatic negative GI ROS, Neg liver ROS,   Endo/Other  diabetes, Insulin Dependent  Renal/GU      Musculoskeletal   Abdominal   Peds  Hematology   Anesthesia Other Findings   Reproductive/Obstetrics                            Lab Results  Component Value Date   WBC 10.1 12/01/2017   HGB 15.3 11/23/2017   HCT 45.0 12/04/2017   MCV 89.8 12/05/2017   PLT 248 12/17/2017   Lab Results  Component Value Date   CREATININE 0.70 12/08/2017   BUN 14 12/13/2017   NA 139 11/29/2017   K 3.4 (L) 12/01/2017   CL 102 11/21/2017   CO2 25 12/17/2017    Anesthesia Physical Anesthesia Plan  ASA: IV and emergent  Anesthesia Plan: General   Post-op Pain Management:    Induction: Intravenous  PONV Risk Score and Plan: 1 and Treatment may vary due to age or medical condition and Ondansetron  Airway Management Planned: Oral ETT  Additional Equipment: Arterial line  Intra-op Plan:   Post-operative Plan: Post-operative intubation/ventilation  Informed Consent: I have reviewed the patients History and Physical, chart, labs and discussed the procedure including the risks, benefits and alternatives for the proposed anesthesia with the patient or authorized representative who has indicated his/her understanding and acceptance.    Dental advisory given  Plan Discussed with: CRNA  Anesthesia Plan Comments:        Anesthesia Quick Evaluation

## 2017-11-25 NOTE — Procedures (Signed)
S/P 4 vessel cerebral arteriogram Rt CFA approach. Findings. 1.No occlusions,stenosis or dissections extracranially or intracranially. Occluded Rt common femoral  Below the inguinal ligament  With distal recostitution of the profuda from prominent collaterals and  brisk run off. Extremity warm and pink with dopplerable DPs and PTs bilaterally unchanged from start of procedure..Marland Kitchen

## 2017-11-25 NOTE — Progress Notes (Signed)
Patient ID: Todd BlaseWilliam B Birge, male   DOB: 03/09/1946, 72 y.o.   MRN: [id number] INR. Patatient to have bilaterally occluded SFAs by notes  In epic. S.Creta Dorame MD

## 2017-11-25 NOTE — Transfer of Care (Signed)
Immediate Anesthesia Transfer of Care Note  Patient: Eunice BlaseWilliam B Luth  Procedure(s) Performed: RADIOLOGY WITH ANESTHESIA (N/A )  Patient Location: NICU  Anesthesia Type:General  Level of Consciousness: Patient remains intubated per anesthesia plan  Airway & Oxygen Therapy: Patient remains intubated per anesthesia plan  Post-op Assessment: Report given to RN and Post -op Vital signs reviewed and stable  Post vital signs: Reviewed and stable  Last Vitals:  Vitals:   Oct 22, 2018 1600 Oct 22, 2018 1615  BP: (!) 173/88 (!) 187/101  Pulse: 73 80  Resp: 19 20  SpO2: 92% 95%    Last Pain: There were no vitals filed for this visit.       Complications: No apparent anesthesia complications

## 2017-11-25 NOTE — Progress Notes (Signed)
Pharmacist Code Stroke Response  Notified to mix tPA at 1457 by Dr. Laurence SlateAroor. Delivered tPA to RN at 1459   George H. O'Brien, Jr. Va Medical CenterBATCHELDER,Yajahira Tison J 11/26/2017 3:13 PM

## 2017-11-25 NOTE — ED Notes (Signed)
Pts nephew returned MD call.

## 2017-11-25 NOTE — H&P (Addendum)
Chief Complaint: R side weakness and left gaze deviation  History obtained from: Patient and Chart    HPI:                                                                                                                                       Todd [name] is an 72 y.o. male right-handed past medical history of carotid stenosis, CHF, diabetes, hyperlipidemia, hypertension, obesity, peripheral arterial disease on aspirin presents with right sided weakness that began at 1 PM today. The patient notices right arm and leg becoming weak and had a fall around 1 PM. He waited for an hour but his symptoms did not improve and called 911. On arrival EMS noticed a right arm drift, facial droop, slurred speech. His blood pressure was 160/90. On arrival at [facility] ER, symptoms have gotten worse and patient plegic on both right arm and right leg, left gaze preference and dysarthria. However was noticed the patient was not severely aphasic on arrival. CT head was negative for hemorrhage and patient received TPA. He denies any history of hematuria/ GI bleeding/recent surgery. CT angiogram and CT perfusion were obtained. CT perfusion showed no perfusion deficit, however CT angiogram showed a M2 occlusion. Given patient's right-sided deficits, gaze deviation patient was deemed a candidate for thrombectomy. Absence of perfusion deficit in the left MCA, no significant aphasia- consider doing a stat MRI however this was delayed life-saving treatment and decided to proceed to angio suite.     Date last known well: 1.6.19 Time last known well: 1pm tPA Given: yes NIHSS: 17 ( at time of initial assessment on arrival)  Baseline MRS 0    Past Medical History:  Diagnosis Date  . 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 Carotid stenosis   . CHF (congestive heart failure) (HCC)   . Depression   . Diabetes mellitus without complication (HCC)   . Hyperlipidemia   . Hypertension   . Obesity    . Peripheral arterial disease (HCC)   . Polyuria     Past Surgical History:  Procedure Laterality Date  . CAROTID ENDARTERECTOMY      Family History  Problem Relation Age of Onset  . Peripheral vascular disease Father    Social History:  reports that he has been smoking cigarettes.  he has never used smokeless tobacco. He reports that he drinks about 4.2 oz of alcohol per week. He reports that he does not use drugs.  Allergies: No Known Allergies  Medications:  I reviewed home medications. On ASA   ROS:                                                                                                                                     14 systems reviewed and negative except above .    Examination:                                                                                                      General: Appears well-developed and well-nourished.  Psych: Affect appropriate to situation Eyes: No scleral injection HENT: No OP obstrucion Head: Normocephalic.  Cardiovascular: Normal rate and regular rhythm.  Respiratory: Effort normal and breath sounds normal to anterior ascultation GI: Soft.  No distension. There is no tenderness.  Skin: WDI   Neurological Examination Mental Status: Alert, oriented, thought content appropriate. Speech mostly fluent, some difficulty naming.  Moderate dysarthria. Able to follow 3 step commands without difficulty. Cranial Nerves: II: Visual fields : Chronic right eye blindness , left homonymous hemianopsia  III,IV, VI: ptosis not present, forced deviation to left side (more prominent later on ) pupils equal, round, reactive to light and accommodation V,VII RIGHT FACIAL DROOP  VIII: hearing normal bilaterally IX,X: uvula rises symmetrically XI: bilateral shoulder shrug XII: midline tongue extension Motor: Right : Upper  extremity   0/5    Left:     Upper extremity   5/5  Lower extremity   0/5     Lower extremity   5/5 Tone and bulk:normal tone throughout; no atrophy noted Sensory: Reduced sensation on right side , no neglect  Deep Tendon Reflexes: 2+ and symmetric throughout Plantars: Right: downgoing   Left: downgoing Cerebellar: No obvious ataxia on the left hand  Gait:Unable to assess     Lab Results: Basic Metabolic Panel: Recent Labs  Lab Dec 16, 2017 1441 12-16-2017 1454  NA 135 139  K 3.3* 3.4*  CL 102 102  CO2 25  --   GLUCOSE 190* 192*  BUN 13 14  CREATININE 0.76 0.70  CALCIUM 9.5  --     CBC: Recent Labs  Lab Dec 16, 2017 1441 12-16-17 1454  WBC 10.1  --   NEUTROABS 7.6  --   HGB 15.3 15.3  HCT 44.8 45.0  MCV 89.8  --   PLT 248  --     Coagulation Studies: Recent Labs    Dec 16, 2017 1441  LABPROT 14.2  INR 1.10    Imaging: Ct Angio Head W Or Wo Contrast  Result Date: 12/02/2017 CLINICAL DATA:  Right-sided weakness and slurred speech. EXAM: CT ANGIOGRAPHY HEAD AND NECK CT PERFUSION BRAIN TECHNIQUE: Multidetector CT imaging of the head and neck was performed using the standard protocol during bolus administration of intravenous contrast. Multiplanar CT image reconstructions and MIPs were obtained to evaluate the vascular anatomy. Carotid stenosis measurements (when applicable) are obtained utilizing NASCET criteria, using the distal internal carotid diameter as the denominator. Multiphase CT imaging of the brain was performed following IV bolus contrast injection. Subsequent parametric perfusion maps were calculated using RAPID software. CONTRAST:  90mL ISOVUE-370 IOPAMIDOL (ISOVUE-370) INJECTION 76% COMPARISON:  Chest CT 07/06/2015. FINDINGS: CTA NECK FINDINGS Aortic arch: Normal variant 4 vessel aortic arch with the left vertebral artery arising just proximal to the left subclavian artery. Widely patent arch vessel origins. Mild nonstenotic plaque in both subclavian arteries. Right  carotid system: Patent with evidence of prior endarterectomy. Mild luminal irregularity of the common carotid artery and carotid bulb without significant stenosis. Left carotid system: Patent with mild, predominantly calcified plaque about the carotid bifurcation without evidence of significant stenosis or dissection. Vertebral arteries: Patent with the right being dominant. Calcified plaque near both vertebral artery origins without associated stenosis. Skeleton: Mild cervical disc and facet degeneration. Other neck: No mass or lymph node enlargement. Upper chest: Moderate-sized, loculated right pleural effusion with mild pleural thickening, incompletely imaged. Associated compressive right lung atelectasis. Mild centrilobular emphysema. 12 x 9 mm part solid left upper lobe lung nodule (series 7, image 171), with only faint ground-glass density on the prior chest CT. Review of the MIP images confirms the above findings CTA HEAD FINDINGS Anterior circulation: The internal carotid arteries are patent from skullbase to carotid termini with extensive siphon atherosclerosis bilaterally resulting in moderate to severe left and moderate right cavernous stenoses and moderate to severe right and moderate left para clinoid stenoses. The left M1 segment and MCA bifurcation are widely patent, however there is occlusion of the M2 superior division just beyond its origin. There is reconstitution of a diseased superior division branch vessel anteriorly in the sylvian fissure, however there is a decreased number of superior division distal branch vessels overall compared to the contralateral side. The right MCA and both ACAs are patent with moderate branch vessel atherosclerotic irregular narrowing but no evidence of proximal branch occlusion or significant proximal stenosis. No aneurysm. Posterior circulation: Both V4 segments are small and partially obscured by metallic dental streak artifact, particularly on the left. The left  V4 segment is occluded distal to the PICA origin. The right V4 segment is irregular with possible multiple severe stenoses. Right PICA origin is patent. The basilar artery is patent but extremely hypoplastic diffusely. There are fetal type origins of both PCAs, and there is a severe stenosis in the left P1-P2 junction region. There are also moderate tandem proximal left P2 stenoses, and there is asymmetric attenuation of distal left PCA branch vessels compared to the right. No significant proximal right PCA stenosis is seen. No aneurysm. Venous sinuses: Grossly patent. Anatomic variants: Fetal origins of the PCAs. Delayed phase: Not performed. Review of the MIP images confirms the above findings CT Brain Perfusion Findings: CBF (<30%) Volume: 0mL Perfusion (Tmax>6.0s) volume: 0mL (small amount of likely artifactual apparent prolonged transit in the cerebellum) Mismatch Volume: n/a Infarction Location: n/a IMPRESSION: 1. Proximal left M2 superior division occlusion with partial distal reconstitution. 2. Extensive ICA siphon atherosclerosis with moderate to severe stenoses bilaterally. 3. 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 Occlusion of the nondominant left vertebral artery distal to PICA. Possible severe right V4 stenoses, not well evaluated. 4. Moderate to severe proximal left PCA stenoses. 5. Patent cervical carotid arteries without significant stenosis. Prior right carotid endarterectomy. 6. Aortic Atherosclerosis (ICD10-I70.0) and Emphysema (ICD10-J43.9). 7. Moderate-sized, loculated right pleural effusion with pleural thickening suggesting chronicity. 8. 11 mm part solid pulmonary nodule in the left upper lobe. Follow-up non-contrast CT recommended at 3-6 months to confirm persistence. If unchanged, and solid component remains <6 mm, annual CT is recommended until 5 years of stability has been established. If persistent these nodules should be considered highly suspicious  if the solid component of the nodule is 6 mm or greater in size and enlarging. This recommendation follows the consensus statement: Guidelines for Management of Incidental Pulmonary Nodules Detected on CT Images: From the Fleischner Society 2017; Radiology 2017; 284:228-243. Study reviewed in person with Dr. Laurence Slate on 2017/12/23 at 3:27 p.m. Electronically Signed   By: Sebastian Ache M.D.   On: 12/23/2017 16:13   Ct Head Wo Contrast  Result Date: 2017/12/23 CLINICAL DATA:  Right-sided weakness and slurred speech. Left M2 occlusion on CTA. Status post tPA in ER. Status post cerebral angiogram without vessel occlusion. EXAM: CT HEAD WITHOUT CONTRAST TECHNIQUE: Contiguous axial images were obtained from the base of the skull through the vertex without intravenous contrast. COMPARISON:  Head CT 2017/12/23 at 1453 hours FINDINGS: Brain: Mildly asymmetric hypodensity in the posterior limb of the left internal capsule is similar to the earlier CT. There is no evidence of new/ expanding infarct, intracranial hemorrhage, mass, midline shift, or extra-axial fluid collection. There is mild cerebral atrophy. Mild bilateral cerebral white matter hypoattenuation is unchanged and may reflect mild chronic small vessel ischemia. Vascular: Calcified atherosclerosis at the skullbase. Residual intravascular contrast. Skull: No fracture or focal osseous lesion. Sinuses/Orbits: Mild mucosal thickening in the paranasal sinuses, greatest in the bilateral ethmoid air cells. Clear mastoid air cells. Unremarkable orbits. Other: Partially visualized endotracheal tube. IMPRESSION: 1. No intracranial hemorrhage. 2. Unchanged appearance of the brain. No evidence of new or enlarging infarct. Electronically Signed   By: Sebastian Ache M.D.   On: 2017-12-23 20:46   Ct Angio Neck W Or Wo Contrast  Result Date: December 23, 2017 CLINICAL DATA:  Right-sided weakness and slurred speech. EXAM: CT ANGIOGRAPHY HEAD AND NECK CT PERFUSION BRAIN TECHNIQUE:  Multidetector CT imaging of the head and neck was performed using the standard protocol during bolus administration of intravenous contrast. Multiplanar CT image reconstructions and MIPs were obtained to evaluate the vascular anatomy. Carotid stenosis measurements (when applicable) are obtained utilizing NASCET criteria, using the distal internal carotid diameter as the denominator. Multiphase CT imaging of the brain was performed following IV bolus contrast injection. Subsequent parametric perfusion maps were calculated using RAPID software. CONTRAST:  90mL ISOVUE-370 IOPAMIDOL (ISOVUE-370) INJECTION 76% COMPARISON:  Chest CT 07/06/2015. FINDINGS: CTA NECK FINDINGS Aortic arch: Normal variant 4 vessel aortic arch with the left vertebral artery arising just proximal to the left subclavian artery. Widely patent arch vessel origins. Mild nonstenotic plaque in both subclavian arteries. Right carotid system: Patent with evidence of prior endarterectomy. Mild luminal irregularity of the common carotid artery and carotid bulb without significant stenosis. Left carotid system: Patent with mild, predominantly calcified plaque about the carotid bifurcation without evidence of significant stenosis or dissection. Vertebral arteries: Patent with the right being dominant. Calcified plaque near both vertebral artery origins without associated stenosis. Skeleton: Mild cervical disc and facet degeneration. Other neck:  No mass or lymph node enlargement. Upper chest: Moderate-sized, loculated right pleural effusion with mild pleural thickening, incompletely imaged. Associated compressive right lung atelectasis. Mild centrilobular emphysema. 12 x 9 mm part solid left upper lobe lung nodule (series 7, image 171), with only faint ground-glass density on the prior chest CT. Review of the MIP images confirms the above findings CTA HEAD FINDINGS Anterior circulation: The internal carotid arteries are patent from skullbase to carotid  termini with extensive siphon atherosclerosis bilaterally resulting in moderate to severe left and moderate right cavernous stenoses and moderate to severe right and moderate left para clinoid stenoses. The left M1 segment and MCA bifurcation are widely patent, however there is occlusion of the M2 superior division just beyond its origin. There is reconstitution of a diseased superior division branch vessel anteriorly in the sylvian fissure, however there is a decreased number of superior division distal branch vessels overall compared to the contralateral side. The right MCA and both ACAs are patent with moderate branch vessel atherosclerotic irregular narrowing but no evidence of proximal branch occlusion or significant proximal stenosis. No aneurysm. Posterior circulation: Both V4 segments are small and partially obscured by metallic dental streak artifact, particularly on the left. The left V4 segment is occluded distal to the PICA origin. The right V4 segment is irregular with possible multiple severe stenoses. Right PICA origin is patent. The basilar artery is patent but extremely hypoplastic diffusely. There are fetal type origins of both PCAs, and there is a severe stenosis in the left P1-P2 junction region. There are also moderate tandem proximal left P2 stenoses, and there is asymmetric attenuation of distal left PCA branch vessels compared to the right. No significant proximal right PCA stenosis is seen. No aneurysm. Venous sinuses: Grossly patent. Anatomic variants: Fetal origins of the PCAs. Delayed phase: Not performed. Review of the MIP images confirms the above findings CT Brain Perfusion Findings: CBF (<30%) Volume: 0mL Perfusion (Tmax>6.0s) volume: 0mL (small amount of likely artifactual apparent prolonged transit in the cerebellum) Mismatch Volume: n/a Infarction Location: n/a IMPRESSION: 1. Proximal left M2 superior division occlusion with partial distal reconstitution. 2. Extensive ICA siphon  atherosclerosis with moderate to severe stenoses bilaterally. 3. 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 Occlusion of the nondominant left vertebral artery distal to PICA. Possible severe right V4 stenoses, not well evaluated. 4. Moderate to severe proximal left PCA stenoses. 5. Patent cervical carotid arteries without significant stenosis. Prior right carotid endarterectomy. 6. Aortic Atherosclerosis (ICD10-I70.0) and Emphysema (ICD10-J43.9). 7. Moderate-sized, loculated right pleural effusion with pleural thickening suggesting chronicity. 8. 11 mm part solid pulmonary nodule in the left upper lobe. Follow-up non-contrast CT recommended at 3-6 months to confirm persistence. If unchanged, and solid component remains <6 mm, annual CT is recommended until 5 years of stability has been established. If persistent these nodules should be considered highly suspicious if the solid component of the nodule is 6 mm or greater in size and enlarging. This recommendation follows the consensus statement: Guidelines for Management of Incidental Pulmonary Nodules Detected on CT Images: From the Fleischner Society 2017; Radiology 2017; 284:228-243. Study reviewed in person with Dr. Laurence SlateAroor on 01/06/2018 at 3:27 p.m. Electronically Signed   By: Sebastian AcheAllen  Grady M.D.   On: 01/06/2018 16:13   Ct Cerebral Perfusion W Contrast  Result Date: 12/02/2017 CLINICAL DATA:  Right-sided weakness and slurred speech. EXAM: CT ANGIOGRAPHY HEAD AND NECK CT PERFUSION BRAIN TECHNIQUE: Multidetector CT imaging of the head and neck was performed using  the standard protocol during bolus administration of intravenous contrast. Multiplanar CT image reconstructions and MIPs were obtained to evaluate the vascular anatomy. Carotid stenosis measurements (when applicable) are obtained utilizing NASCET criteria, using the distal internal carotid diameter as the denominator. Multiphase CT imaging of the brain was performed  following IV bolus contrast injection. Subsequent parametric perfusion maps were calculated using RAPID software. CONTRAST:  90mL ISOVUE-370 IOPAMIDOL (ISOVUE-370) INJECTION 76% COMPARISON:  Chest CT 07/06/2015. FINDINGS: CTA NECK FINDINGS Aortic arch: Normal variant 4 vessel aortic arch with the left vertebral artery arising just proximal to the left subclavian artery. Widely patent arch vessel origins. Mild nonstenotic plaque in both subclavian arteries. Right carotid system: Patent with evidence of prior endarterectomy. Mild luminal irregularity of the common carotid artery and carotid bulb without significant stenosis. Left carotid system: Patent with mild, predominantly calcified plaque about the carotid bifurcation without evidence of significant stenosis or dissection. Vertebral arteries: Patent with the right being dominant. Calcified plaque near both vertebral artery origins without associated stenosis. Skeleton: Mild cervical disc and facet degeneration. Other neck: No mass or lymph node enlargement. Upper chest: Moderate-sized, loculated right pleural effusion with mild pleural thickening, incompletely imaged. Associated compressive right lung atelectasis. Mild centrilobular emphysema. 12 x 9 mm part solid left upper lobe lung nodule (series 7, image 171), with only faint ground-glass density on the prior chest CT. Review of the MIP images confirms the above findings CTA HEAD FINDINGS Anterior circulation: The internal carotid arteries are patent from skullbase to carotid termini with extensive siphon atherosclerosis bilaterally resulting in moderate to severe left and moderate right cavernous stenoses and moderate to severe right and moderate left para clinoid stenoses. The left M1 segment and MCA bifurcation are widely patent, however there is occlusion of the M2 superior division just beyond its origin. There is reconstitution of a diseased superior division branch vessel anteriorly in the sylvian  fissure, however there is a decreased number of superior division distal branch vessels overall compared to the contralateral side. The right MCA and both ACAs are patent with moderate branch vessel atherosclerotic irregular narrowing but no evidence of proximal branch occlusion or significant proximal stenosis. No aneurysm. Posterior circulation: Both V4 segments are small and partially obscured by metallic dental streak artifact, particularly on the left. The left V4 segment is occluded distal to the PICA origin. The right V4 segment is irregular with possible multiple severe stenoses. Right PICA origin is patent. The basilar artery is patent but extremely hypoplastic diffusely. There are fetal type origins of both PCAs, and there is a severe stenosis in the left P1-P2 junction region. There are also moderate tandem proximal left P2 stenoses, and there is asymmetric attenuation of distal left PCA branch vessels compared to the right. No significant proximal right PCA stenosis is seen. No aneurysm. Venous sinuses: Grossly patent. Anatomic variants: Fetal origins of the PCAs. Delayed phase: Not performed. Review of the MIP images confirms the above findings CT Brain Perfusion Findings: CBF (<30%) Volume: 0mL Perfusion (Tmax>6.0s) volume: 0mL (small amount of likely artifactual apparent prolonged transit in the cerebellum) Mismatch Volume: n/a Infarction Location: n/a IMPRESSION: 1. Proximal left M2 superior division occlusion with partial distal reconstitution. 2. Extensive ICA siphon atherosclerosis with moderate to severe stenoses bilaterally. 3. 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 Occlusion of the nondominant left vertebral artery distal to PICA. Possible severe right V4 stenoses, not well evaluated. 4. Moderate to severe proximal left PCA stenoses. 5. Patent cervical carotid arteries  without significant stenosis. Prior right carotid endarterectomy. 6. Aortic Atherosclerosis  (ICD10-I70.0) and Emphysema (ICD10-J43.9). 7. Moderate-sized, loculated right pleural effusion with pleural thickening suggesting chronicity. 8. 11 mm part solid pulmonary nodule in the left upper lobe. Follow-up non-contrast CT recommended at 3-6 months to confirm persistence. If unchanged, and solid component remains <6 mm, annual CT is recommended until 5 years of stability has been established. If persistent these nodules should be considered highly suspicious if the solid component of the nodule is 6 mm or greater in size and enlarging. This recommendation follows the consensus statement: Guidelines for Management of Incidental Pulmonary Nodules Detected on CT Images: From the Fleischner Society 2017; Radiology 2017; 284:228-243. Study reviewed in person with Dr. Laurence Slate on 12/19/2017 at 3:27 p.m. Electronically Signed   By: Sebastian Ache M.D.   On: 12/09/2017 16:13   Dg Chest Port 1 View  Result Date: 11/22/2017 CLINICAL DATA:  Ventilator dependent EXAM: PORTABLE CHEST 1 VIEW COMPARISON:  07/06/2015 FINDINGS: Endotracheal tube is in place, tip 7.0 cm above the carina. Heart size is accentuated by the technique. There is a loculated right pleural effusion. There is dense opacity in the retrocardiac region on the right, consistent with effusion and/or infiltrate. The left lung is essentially clear. There is pulmonary vascular congestion. Remote right fractures. IMPRESSION: Right pleural effusion and lung opacity, consistent with atelectasis or consolidation. Electronically Signed   By: Norva Pavlov M.D.   On: 11/26/2017 21:37   Ct Head Code Stroke Wo Contrast  Result Date: 11/20/2017 CLINICAL DATA:  Code stroke. RIGHT-sided weakness and slurred speech. EXAM: CT HEAD WITHOUT CONTRAST TECHNIQUE: Contiguous axial images were obtained from the base of the skull through the vertex without intravenous contrast. COMPARISON:  None. FINDINGS: Brain: Subtle asymmetric hypoattenuation in the region of the LEFT  posterior limb internal capsule could represent developing infarction or represent an old insult; see image 19 series 3. Otherwise no evidence for acute infarct, hemorrhage, mass lesion, hydrocephalus, or extra-axial fluid. Premature for age atrophy. Hypoattenuation of white matter of a relatively modest nature, could represent small vessel disease. 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 Vascular: Calcification of the cavernous internal carotid arteries and distal vertebral arteries, consistent with cerebrovascular atherosclerotic disease. No signs of intracranial large vessel occlusion. Skull: Calvarium intact. Sinuses/Orbits: No acute finding. Other: None ASPECTS ([place] Stroke Program Early CT Score) - Ganglionic level infarction (caudate, lentiform nuclei, internal capsule, insula, M1-M3 cortex): 6 - Supraganglionic infarction (M4-M6 cortex): 3 Total score (0-10 with 10 being normal): 9 IMPRESSION: 1. Atrophy and small vessel disease. Cannot exclude early LEFT posterior limb internal capsule infarct. Chronic insult LEFT cerebellum. 2. ASPECTS is 9. These results were communicated to Dr. Laurence Slate At 3:12 pmon 01/21/2019by text page via the [facility] messaging system. Electronically Signed   By: Elsie Stain M.D.   On: 11/25/2017 15:15      ASSESSMENT AND PLAN   72 y.o. male right-handed past medical history of carotid stenosis, CHF, diabetes, hyperlipidemia, hypertension, obesity, peripheral arterial disease on aspirin presents with right sided weakness that began at 1 PM today which progressed to result in hemiplegia.  CT head was negative for hemorrhage and patient received TPA. CT angiogram and CT perfusion were obtained. CT perfusion showed no perfusion deficit, however CT angiogram showed a M2 occlusion. Given patient's right-sided deficits, gaze deviation patient was deemed a candidate for thrombectomy.Absence of perfusion deficit in the left MCA, no  significant aphasia- consider doing a  stat MRI however this was delayed life-saving treatment and decided to proceed to angio suite.     Acute ischemic stroke  Recommend # MRI of the brain without contrast #Transthoracic Echo  # No AP first 24 hrs  #Start or continue Atorvastatin 80 mg/other high intensity statin # BP goal: permissive HTN upto 180 systolic # HBAIC and Lipid profile # Telemetry monitoring # Frequent neuro checks # NPO until passes stroke swallow screen  HTN  Upto 180/105 systolic as patient s/p TPA  Diabetes:  SSI, AIC  CHF: hold BC for now, gentle hydration HLD: will continue/start statin PAD; hold AP for now  DVT PPX; SCD   This patient is neurologically critically ill due to Acute stroke and received tPA/  He is at risk for significant risk of neurological worsening from cerebral edema,  death from brain herniation, heart failure, hemorrhagic conversion, infection, respiratory failure and seizure. This patient's care requires constant monitoring of vital signs, hemodynamics, respiratory and cardiac monitoring, review of multiple databases, neurological assessment and medical decision making of high complexity.  I spent  50 minutes of neurocritical time in the care of this patient.      Addendum Repeat CT head post angio neg for bleed,   If 7pm to 7am, please call on call as listed on AMION. Milta Croson [facility] Pager Number [phone]

## 2017-11-25 NOTE — ED Notes (Signed)
Pt reported to EMS that he had bloody mary @ 10am.

## 2017-11-26 ENCOUNTER — Inpatient Hospital Stay (HOSPITAL_COMMUNITY): Payer: Medicare Other

## 2017-11-26 ENCOUNTER — Encounter (HOSPITAL_COMMUNITY): Payer: Self-pay | Admitting: Interventional Radiology

## 2017-11-26 DIAGNOSIS — I361 Nonrheumatic tricuspid (valve) insufficiency: Secondary | ICD-10-CM

## 2017-11-26 DIAGNOSIS — I6300 Cerebral infarction due to thrombosis of unspecified precerebral artery: Secondary | ICD-10-CM

## 2017-11-26 LAB — POCT I-STAT 3, ART BLOOD GAS (G3+)
Acid-base deficit: 1 mmol/L (ref 0.0–2.0)
Bicarbonate: 25.8 mmol/L (ref 20.0–28.0)
O2 SAT: 95 %
PCO2 ART: 47.9 mmHg (ref 32.0–48.0)
PO2 ART: 80 mmHg — AB (ref 83.0–108.0)
TCO2: 27 mmol/L (ref 22–32)
pH, Arterial: 7.339 — ABNORMAL LOW (ref 7.350–7.450)

## 2017-11-26 LAB — LIPID PANEL
CHOL/HDL RATIO: 3.8 ratio
Cholesterol: 168 mg/dL (ref 0–200)
HDL: 44 mg/dL (ref >40)
LDL Cholesterol: 105 mg/dL — ABNORMAL HIGH (ref 0–99)
Triglycerides: 96 mg/dL (ref <150)
VLDL: 19 mg/dL (ref 0–40)

## 2017-11-26 LAB — CBC WITH DIFFERENTIAL/PLATELET
BASOS PCT: 0 %
Basophils Absolute: 0 10*3/uL (ref 0.0–0.1)
EOS PCT: 1 %
Eosinophils Absolute: 0.1 10*3/uL (ref 0.0–0.7)
HEMATOCRIT: 41.4 % (ref 39.0–52.0)
Hemoglobin: 13.7 g/dL (ref 13.0–17.0)
Lymphocytes Relative: 18 %
Lymphs Abs: 1.9 10*3/uL (ref 0.7–4.0)
MCH: 30 pg (ref 26.0–34.0)
MCHC: 33.1 g/dL (ref 30.0–36.0)
MCV: 90.8 fL (ref 78.0–100.0)
MONO ABS: 1 10*3/uL (ref 0.1–1.0)
MONOS PCT: 9 %
NEUTROS ABS: 7.4 10*3/uL (ref 1.7–7.7)
Neutrophils Relative %: 72 %
PLATELETS: 268 10*3/uL (ref 150–400)
RBC: 4.56 MIL/uL (ref 4.22–5.81)
RDW: 13.2 % (ref 11.5–15.5)
WBC: 10.4 10*3/uL (ref 4.0–10.5)

## 2017-11-26 LAB — BASIC METABOLIC PANEL
Anion gap: 10 (ref 5–15)
BUN: 16 mg/dL (ref 6–20)
CALCIUM: 8.5 mg/dL — AB (ref 8.9–10.3)
CO2: 25 mmol/L (ref 22–32)
CREATININE: 0.87 mg/dL (ref 0.61–1.24)
Chloride: 103 mmol/L (ref 101–111)
GFR calc non Af Amer: >60 mL/min (ref >60)
GLUCOSE: 188 mg/dL — AB (ref 65–99)
Potassium: 2.9 mmol/L — ABNORMAL LOW (ref 3.5–5.1)
Sodium: 138 mmol/L (ref 135–145)

## 2017-11-26 LAB — MRSA PCR SCREENING: MRSA by PCR: NEGATIVE

## 2017-11-26 LAB — GLUCOSE, CAPILLARY
GLUCOSE-CAPILLARY: 157 mg/dL — AB (ref 65–99)
GLUCOSE-CAPILLARY: 184 mg/dL — AB (ref 65–99)
Glucose-Capillary: 188 mg/dL — ABNORMAL HIGH (ref 65–99)
Glucose-Capillary: 156 mg/dL — ABNORMAL HIGH (ref 65–99)
Glucose-Capillary: 174 mg/dL — ABNORMAL HIGH (ref 65–99)
Glucose-Capillary: 181 mg/dL — ABNORMAL HIGH (ref 65–99)

## 2017-11-26 LAB — PHOSPHORUS: PHOSPHORUS: 4.2 mg/dL (ref 2.5–4.6)

## 2017-11-26 LAB — TSH: TSH: 1.212 u[IU]/mL (ref 0.350–4.500)

## 2017-11-26 LAB — HEMOGLOBIN A1C
Hgb A1c MFr Bld: 7.6 % — ABNORMAL HIGH (ref 4.8–5.6)
MEAN PLASMA GLUCOSE: 171.42 mg/dL

## 2017-11-26 LAB — POTASSIUM: POTASSIUM: 4.5 mmol/L (ref 3.5–5.1)

## 2017-11-26 LAB — MAGNESIUM: Magnesium: 1.8 mg/dL (ref 1.7–2.4)

## 2017-11-26 MED ORDER — LABETALOL HCL 5 MG/ML IV SOLN
20.0000 mg | INTRAVENOUS | Status: DC | PRN
  Administered 2017-11-26 – 2017-11-27 (×2): 20 mg via INTRAVENOUS
  Filled 2017-11-26 (×2): qty 4

## 2017-11-26 MED ORDER — POTASSIUM CHLORIDE 10 MEQ/100ML IV SOLN
10.0000 meq | INTRAVENOUS | Status: AC
  Administered 2017-11-26 (×5): 10 meq via INTRAVENOUS
  Filled 2017-11-26 (×5): qty 100

## 2017-11-26 MED ORDER — ORAL CARE MOUTH RINSE
15.0000 mL | Freq: Two times a day (BID) | OROMUCOSAL | Status: DC
  Administered 2017-11-27 – 2017-12-03 (×13): 15 mL via OROMUCOSAL

## 2017-11-26 MED ORDER — CHLORHEXIDINE GLUCONATE 0.12 % MT SOLN
15.0000 mL | Freq: Two times a day (BID) | OROMUCOSAL | Status: DC
  Administered 2017-11-27 – 2017-12-03 (×13): 15 mL via OROMUCOSAL
  Filled 2017-11-26 (×5): qty 15

## 2017-11-26 MED ORDER — ASPIRIN 300 MG RE SUPP
300.0000 mg | Freq: Every day | RECTAL | Status: DC
  Administered 2017-11-26 – 2017-12-01 (×6): 300 mg via RECTAL
  Filled 2017-11-26 (×6): qty 1

## 2017-11-26 MED ORDER — ONDANSETRON HCL 4 MG/2ML IJ SOLN
INTRAMUSCULAR | Status: AC
  Administered 2017-11-26: 4 mg
  Filled 2017-11-26: qty 2

## 2017-11-26 NOTE — Progress Notes (Signed)
NEUROHOSPITALISTS STROKE TEAM - DAILY PROGRESS NOTE   ADMISSION HISTORY: Todd [name] is an 72 y.o. male right-handed past medical history of carotid stenosis, CHF, diabetes, hyperlipidemia, hypertension, obesity, peripheral arterial disease on aspirin presents with right sided weakness that began at 1 PM today. The patient notices right arm and leg becoming weak and had a fall around 1 PM. He waited for an hour but his symptoms did not improve and called 911. On arrival EMS noticed a right arm drift, facial droop, slurred speech. His blood pressure was 160/90. On arrival at [facility] ER, symptoms have gotten worse and patient plegic on both right arm and right leg, left gaze preference and dysarthria. However was noticed the patient was not severely aphasic on arrival. CT head was negative for hemorrhage and patient received TPA. He denies any history of hematuria/ GI bleeding/recent surgery. CT angiogram and CT perfusion were obtained. CT perfusion showed no perfusion deficit, however CT angiogram showed a M2 occlusion. Given patient's right-sided deficits, gaze deviation patient was deemed a candidate for thrombectomy. Absence of perfusion deficit in the left MCA, no significant aphasia- consider doing a stat MRI however this was delayed life-saving treatment and decided to proceed to angio suite.   Date last known well: 1.6.19 Time last known well: 1pm tPA Given: yes NIHSS: 17 Baseline MRS 0  SUBJECTIVE (INTERVAL HISTORY) Sister is at the bedside. Patient is found laying in bed in NAD. Sister voices no new complaints. No new/acute events reported overnight. Imaging, Lab results and POC reviewed with sister at bedside.   OBJECTIVE Lab Results: CBC:  Recent Labs  Lab 11/30/2017 1441 12/05/2017 1454 11/26/17 0524  WBC 10.1  --  10.4  HGB 15.3 15.3 13.7  HCT 44.8 45.0 41.4  MCV 89.8  --  90.8  PLT 248  --  268   BMP: Recent Labs  Lab  11/30/2017 1441 12/05/2017 1454 11/26/17 0524  NA 135 139 138  K 3.3* 3.4* 2.9*  CL 102 102 103  CO2 25  --  25  GLUCOSE 190* 192* 188*  BUN 13 14 16   CREATININE 0.76 0.70 0.87  CALCIUM 9.5  --  8.5*  MG  --   --  1.8  PHOS  --   --  4.2   Liver Function Tests:  Recent Labs  Lab 12/12/2017 1441  AST 20  ALT 19  ALKPHOS 73  BILITOT 0.7  PROT 7.4  ALBUMIN 3.4*   Thyroid Function Studies:  Recent Labs    11/26/17 0830  TSH 1.212   Coagulation Studies:  Recent Labs    11/23/2017 1441  APTT 26  INR 1.10   PHYSICAL EXAM Temp:  [97.6 F (36.4 C)-97.9 F (36.6 C)] 97.9 F (36.6 C) (01/07 1200) Pulse Rate:  [42-80] 65 (01/07 1430) Resp:  [9-35] 16 (01/07 1430) BP: (95-187)/(53-115) 178/81 (01/07 1430) SpO2:  [92 %-100 %] 96 % (01/07 1430) Arterial Line BP: (104-214)/(47-94) 193/71 (01/07 1430) FiO2 (%):  [40 %-50 %] 40 % (01/07 1200) General - Well nourished, well developed, in no apparent distress, Intubated HEENT-  Normocephalic, Normal external eye/conjunctiva.  Normal external ears. Normal external nose, mucus membranes and septum.   Cardiovascular - Regular rate and rhythm  Respiratory - Lungs clear bilaterally. No wheezing. Abdomen - soft and non-tender, BS normal Extremities- no edema or cyanosis Neurological Examination Mental Status: Intubated and sedated. Nonverbal. Follows only very simple commands. Cranial Nerves: II: Visual fields : Chronic right eye blindness , left  homonymous hemianopsia  III,IV, VI: ptosis not present, forced deviation to left side. pupils equal, round, reactive to light and accommodation V,VII RIGHT FACIAL DROOP  VIII: hearing normal bilaterally IX,X: uvula rises symmetrically XI: bilateral shoulder shrug XII: midline tongue extension Motor: Right :  Upper extremity   0/5                                      Left:     Upper extremity   5/5             Lower extremity   0/5                                                  Lower  extremity   5/5 Tone and bulk:normal tone throughout; no atrophy noted Sensory: Reduced sensation on right side , +neglect  Plantars: Right: downgoing                                Left: downgoing Gait:Not tested  IMAGING: I have personally reviewed the radiological images below and agree with the radiology interpretations. Ct Angio Head and Neck W Or Wo Contrast with Perfusion Result Date: 11/30/17 IMPRESSION: 1. Proximal left M2 superior division occlusion with partial distal reconstitution. 2. Extensive ICA siphon atherosclerosis with moderate to severe stenoses bilaterally. 3. 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 Occlusion of the nondominant left vertebral artery distal to PICA. Possible severe right V4 stenoses, not well evaluated. 4. Moderate to severe proximal left PCA stenoses. 5. Patent cervical carotid arteries without significant stenosis. Prior right carotid endarterectomy. 6. Aortic Atherosclerosis (ICD10-I70.0) and Emphysema (ICD10-J43.9). 7. Moderate-sized, loculated right pleural effusion with pleural thickening suggesting chronicity. 8. 11 mm part solid pulmonary nodule in the left upper lobe.  Ct Head Wo Contrast Result Date: 11-30-17 IMPRESSION: 1. No intracranial hemorrhage. 2. Unchanged appearance of the brain. No evidence of new or enlarging infarct.   Mr Brain Wo Contrast Result Date: 11/26/2017 IMPRESSION: 1. Acute/early subacute infarction involving left posterior limb of internal capsule extending into left anterior parahippocampal gyrus and hippocampus. Additional punctate infarct within left inferior cerebellar hemisphere. No hemorrhage or mass effect. 2. Background of mild chronic microvascular ischemic changes and mild parenchymal volume loss of the brain. 3. 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 4. Mild paranasal sinus disease.   Dg Chest Port 1 View Result Date: 11-30-2017 IMPRESSION: Right pleural  effusion and lung opacity, consistent with atelectasis or consolidation.  Ct Head Code Stroke Wo Contrast Result Date: 11/30/17 IMPRESSION: 1. Atrophy and small vessel disease. Cannot exclude early LEFT posterior limb internal capsule infarct. Chronic insult LEFT cerebellum. 2. ASPECTS is 9.   Echocardiogram:                                              PENDING     IMPRESSION: [name] is a 72 y.o. male with PMH of carotid stenosis, CHF, diabetes, hyperlipidemia, hypertension, obesity, peripheral arterial disease on aspirin presents with acute onset of right sided weakness, which progressed  to result in hemiplegia.  CT head was negative for hemorrhage and patient received IV tPA. CT perfusion showed no perfusion deficit, however CT angiogram showed a M2 occlusion. Given patient's right-sided deficits, gaze deviation patient was deemed a candidate for thrombectomy. MRI reveals:  Acute/early subacute infarction Left posterior limb of internal capsule extending into left anterior parahippocampal gyrus and hippocampus Infarct within Left inferior cerebellar hemisphere.  Treated with IV TPA STROKE:  Suspected Etiology: Left M2 occlusion and  possibly complication of catheter angiogram  Resultant Symptoms: right-sided deficits, gaze deviation  Stroke Risk Factors: diabetes mellitus, hyperlipidemia and hypertension Other Stroke Risk Factors: Advanced age, Cigarette smoker, ETOH use, Obesity, Body mass index is 30.5 kg/m. , Hx stroke, OSA/likely undx, CHF, PAD  PROCEDURES: 11/29/2017  Dr Corliss Skainseveshwar S/P 4 vessel cerebral arteriogram Rt CFA approach. Findings. 1.No occlusions,stenosis or dissections extracranially or intracranially. Occluded Rt common femoral  Below the inguinal ligament  With distal recostitution of the profuda from prominent collaterals and  brisk run off.  Outstanding Stroke Work-up Studies:     Echocardiogram:                                                     PENDING  11/26/2017 ASSESSMENT:   Patient remains intubated and sedated.  Following only minimal commands.  Nonverbal.  No movement of right side.  CCM following.  Appreciate assistance.  Plan for extubation later today.  We will continue to follow closely.  PLAN  11/26/2017: Continue Statin HOLD ASA until 24 hour post tPA neuroimaging is stable & without evidence of bleeding Frequent neuro checks Telemetry monitoring PT/OT/SLP Consult PM & Rehab Consult Case Management /MSW Ongoing aggressive stroke risk factor management Patient will be counseled to be compliant with his antithrombotic medications Patient will be counseled on Lifestyle modifications including, Diet, Exercise, and Stress Follow up with [facility] in 6 weeks  HX OF STROKES: Small chronic infarct within left middle cerebellar peduncle  INTRACRANIAL Atherosclerosis &Stenosis: May consider DAPT prior to discharge  DYSPHAGIA: NPO until passes SLP swallow evaluation Aspiration Precautions in progress  Medical issues per CCM team Intubated post procedure Hypotension Hypokalemia Hypocalcemia  Pulmonary nodule 11 mm part solid pulmonary nodule in the left upper lobe Outpatient follow-up CT recommended at 3-6 months to confirm persistence If unchanged, and solid component remains <6 mm, annual CT until 5 years of stability  If persistent these nodules should be considered highly suspicious if the solid component of the nodule is 6 mm or greater in size and enlarging.   HYPERTENSION: Stable SBP goal of < 180. DBP goal of < 105.  Nicardipine drip, Labetolol PRN Long term BP goal normotensive. May slowly start B/P medications after 48 hours, if applicable Home Meds: NONE  HYPERLIPIDEMIA:    Component Value Date/Time   CHOL 168 11/26/2017 0524   TRIG 96 11/26/2017 0524   HDL 44 11/26/2017 0524   CHOLHDL 3.8 11/26/2017 0524   VLDL 19 11/26/2017 0524   LDLCALC 105 (H) 11/26/2017 0524  Home Meds:   NONE LDL  goal < 70 Will started on  Lipitor to 40 mg daily, once patient passes SLP evaluation Continue statin at discharge  DIABETES: Lab Results  Component Value Date   HGBA1C 7.6 (H) 11/26/2017  HgbA1c goal < 7.0 Currently on: NovoLog Continue CBG monitoring and  SSI to maintain glucose 140-180 mg/dl DM education   TOBACCO ABUSE and Likely ETOH Abuse Current smoker Smoking cessation counseling provided Nicotine patch provided CIWA, as needed  OBESITY Obesity, Body mass index is 30.5 kg/m. Greater than/equal to 30  Other Active Problems: Active Problems:   Stroke [facility])    Hospital day # 1 VTE prophylaxis: SCD's  Diet : Diet NPO time specified   FAMILY UPDATES: family at bedside  TEAM UPDATES: Micki Riley, MD STATUS:  FULL   Prior Home Stroke Medications:  aspirin 81 mg daily  Discharge Stroke Meds:  Please discharge patient on TBD   Disposition:  Therapy Recs:               PENDING Home Equipment:         PENDING Follow Up:  Follow-up Information    Micki Riley, MD. Schedule an appointment as soon as possible for a visit in 6 week(s).   Specialties:  Neurology, Radiology Contact information: [address] [place] [address] [phone]          Alysia Penna, MD -PCP Follow up in 1-2 weeks     Assessment & plan discussed with with attending physician and they are in agreement.    Beryl Meager, ANP-C Stroke Neurology Team 11/26/2017 2:37 PM I have personally examined this patient, reviewed notes, independently viewed imaging studies, participated in medical decision making and plan of care.ROS completed by me personally and pertinent positives fully documented  I have made any additions or clarifications directly to the above note. Agree with note above.. Recommend extubation later today after patient can sit up for groin sheath was discontinued. Speech therapy and physical occupational therapy consult later. Continue  ongoing stroke workup. Long discussion with patient's sister at the bedside and answered questions about his care. Wean and discontinue norepinephrine drip and change blood pressure parameters to systolic blood pressure goal below 180 This patient is critically ill and at significant risk of neurological worsening, death and care requires constant monitoring of vital signs, hemodynamics,respiratory and cardiac monitoring, extensive review of multiple databases, frequent neurological assessment, discussion with family, other specialists and medical decision making of high complexity.I have made any additions or clarifications directly to the above note.This critical care time does not reflect procedure time, or teaching time or supervisory time of PA/NP/Med Resident etc but could involve care discussion time.  I spent 40 minutes of neurocritical care time  in the care of  this patient.     Delia Heady, MD Medical Director [facility] Pager: [phone] 11/26/2017 4:29 PM  To contact Stroke Continuity provider, please refer to [email or web address]. After hours, contact General Neurology

## 2017-11-26 NOTE — Progress Notes (Signed)
PCCM Interval Progress  Pt tolerating SBT with PS at 5/5 for past 3 hours.   Vitals stable, good volumes, following commands.  Appears ready for extubation.  Will extubate.  Family updated and questions answered.   Rutherford Guysahul Ryeleigh Santore, [place] - [facility] Pager: [phone]  or [phone] 11/26/2017, 12:04 PM

## 2017-11-26 NOTE — Progress Notes (Signed)
OT Cancellation Note  Patient Details Name: Todd [name] MRN: [id number] DOB: 07/17/1946   Cancelled Treatment:    Reason Eval/Treat Not Completed: Patient not medically ready. Pt with strict bedrest orders. Will await increase in activity orders prior to initiating OT evaluation. Thank you for this referral!  Doristine Sectionharity A Tyneshia Stivers, MS OTR/L  Pager: [phone]    Todd [name] A Nelida Mandarino 11/26/2017, 6:45 AM

## 2017-11-26 NOTE — Progress Notes (Signed)
PULMONARY / CRITICAL CARE MEDICINE   Name: Todd [name] MRN: [id number] DOB: [birth year]    ADMISSION DATE:  December 09, 2017   CHIEF COMPLAINT:  Code stroke  HISTORY OF PRESENT ILLNESS:        This is a 72 year old diabetic hypertensive who presented with left gaze deviation and right facial droop who received TPA in the ED. CTA suggested a left M2 occlusion, and he was taken to angiography where no occlusion was found following the tpa. He arrived in the ICU hypertensive. He was given labetalol and became bradycardic into the 40's. Sedation was held to see if bradycardia might be secondary to an intracranial catastrophe. He was following instructions but not moving the right side. CT of the head did not show hemorrhage He has remained intubated overnight. He is sedated with Fentanyl, but still follows simple instructions. He is requiring a small dose of Levophed to support his BP.      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  PAST SURGICAL HISTORY: He  has a past surgical history that includes Carotid endarterectomy.  No Known Allergies  No current facility-administered medications on file prior to encounter.    Current Outpatient Medications on File Prior to Encounter  Medication Sig  . aspirin 81 MG tablet Take 81 mg by mouth daily.  . citalopram (CELEXA) 40 MG tablet Take 40 mg by mouth daily.  . CRESTOR 10 MG tablet   . HUMALOG MIX 75/25 KWIKPEN (75-25) 100 UNIT/ML Kwikpen   . HYDROcodone-acetaminophen (NORCO/VICODIN) 5-325 MG per tablet Reported on 02/11/2016  . insulin lispro protamine-lispro (HUMALOG 75/25 MIX) (75-25) 100 UNIT/ML SUSP injection Inject 57 Units into the skin 2 (two) times daily.  Marland Kitchen  losartan (COZAAR) 25 MG tablet Take 25 mg by mouth daily.  . methocarbamol (ROBAXIN) 500 MG tablet   . Oxycodone-Acetaminophen (PERCOCET PO) Take by mouth. Reported on 02/11/2016  . oxyCODONE-acetaminophen (PERCOCET/ROXICET) 5-325 MG per tablet Reported on 02/11/2016  . tamsulosin (FLOMAX) 0.4 MG CAPS capsule Take 0.4 mg by mouth daily.  . traMADol (ULTRAM) 50 MG tablet     FAMILY HISTORY:  His indicated that the status of his father is unknown.   SOCIAL HISTORY: He  reports that he has been smoking cigarettes.  he has never used smokeless tobacco. He reports that he drinks about 4.2 oz of alcohol per week. He reports that he does not use drugs.  REVIEW OF SYSTEMS:   Unobtainable  SUBJECTIVE:  Unobtainable  VITAL SIGNS: BP (!) 127/54   Pulse (!) 49   Temp 97.9 F (36.6 C) (Axillary)   Resp 18   Ht 6' (1.829 m)   Wt 224 lb 13.9 oz (102 kg)   SpO2 100%   BMI 30.50 kg/m     VENTILATOR SETTINGS: Vent Mode: PRVC FiO2 (%):  [40 %-50 %] 50 % Set Rate:  [16 bmp-18 bmp] 18 bmp Vt Set:  [560 mL-620 mL] 620 mL PEEP:  [5 cmH20] 5 cmH20 Pressure Support:  [10 cmH20] 10 cmH20 Plateau Pressure:  [16 cmH20-25 cmH20] 25 cmH20  INTAKE / OUTPUT: I/O last 3 completed shifts: In: 1569.5 [I.V.:1569.5] Out: 375 [Urine:325; Blood:50]  PHYSICAL EXAMINATION: General: Orally intubated elderly male in no distress. Neuro: Despite being on 150 mcg of  fentanyl he does open eyes to voice.  He moves the left limbs on request but not the right.  He continues to have a left gaze preference. pupils are equal at 2 mm. Cardiovascular: There is a right carotid endarterectomy scar.  There is no JVD, there is no dependent edema.  S1 and S2 are regular without murmur rub, or gallop. Lungs: There is symmetric air movement, respirations are unlabored, no wheezes. Abdomen: The abdomen is somewhat obese soft and nontender there is no organomegaly, there is no icterus.  LABS:  BMET Recent Labs  Lab  12/19/2017 1441 12/13/2017 1454 11/26/17 0524  NA 135 139 138  K 3.3* 3.4* 2.9*  CL 102 102 103  CO2 25  --  25  BUN 13 14 16   CREATININE 0.76 0.70 0.87  GLUCOSE 190* 192* 188*    Electrolytes Recent Labs  Lab 12/07/2017 1441 11/26/17 0524  CALCIUM 9.5 8.5*  MG  --  1.8  PHOS  --  4.2    CBC Recent Labs  Lab 12/20/2017 1441 12/07/2017 1454 11/26/17 0524  WBC 10.1  --  10.4  HGB 15.3 15.3 13.7  HCT 44.8 45.0 41.4  PLT 248  --  268    Coag's Recent Labs  Lab 11/27/2017 1441  APTT 26  INR 1.10    Sepsis Markers No results for input(s): LATICACIDVEN, PROCALCITON, O2SATVEN in the last 168 hours.  ABG Recent Labs  Lab 11/20/2017 2014 12/09/2017 2135  PHART 7.406 7.380  PCO2ART 48.7* 47.9  PO2ART 65.0* 61.0*    Liver Enzymes Recent Labs  Lab 12/08/2017 1441  AST 20  ALT 19  ALKPHOS 73  BILITOT 0.7  ALBUMIN 3.4*    Cardiac Enzymes No results for input(s): TROPONINI, PROBNP in the last 168 hours.  Glucose Recent Labs  Lab 12/12/2017 1442 12/10/2017 2006 12/09/2017 2354 11/26/17 0404  GLUCAP 188* 190* 191* 188*    Imaging Ct Angio Head W Or Wo Contrast  Result Date: 12/13/2017 CLINICAL DATA:  Right-sided weakness and slurred speech. EXAM: CT ANGIOGRAPHY HEAD AND NECK CT PERFUSION BRAIN TECHNIQUE: Multidetector CT imaging of the head and neck was performed using the standard protocol during bolus administration of intravenous contrast. Multiplanar CT image reconstructions and MIPs were obtained to evaluate the vascular anatomy. Carotid stenosis measurements (when applicable) are obtained utilizing NASCET criteria, using the distal internal carotid diameter as the denominator. Multiphase CT imaging of the brain was performed following IV bolus contrast injection. Subsequent parametric perfusion maps were calculated using RAPID software. CONTRAST:  90mL ISOVUE-370 IOPAMIDOL (ISOVUE-370) INJECTION 76% COMPARISON:  Chest CT 07/06/2015. FINDINGS: CTA NECK FINDINGS Aortic  arch: Normal variant 4 vessel aortic arch with the left vertebral artery arising just proximal to the left subclavian artery. Widely patent arch vessel origins. Mild nonstenotic plaque in both subclavian arteries. Right carotid system: Patent with evidence of prior endarterectomy. Mild luminal irregularity of the common carotid artery and carotid bulb without significant stenosis. Left carotid system: Patent with mild, predominantly calcified plaque about the carotid bifurcation without evidence of significant stenosis or dissection. Vertebral arteries: Patent with the right being dominant. Calcified plaque near both vertebral artery origins without associated stenosis. Skeleton: Mild cervical disc and facet degeneration. Other neck: No mass or lymph node enlargement. Upper chest: Moderate-sized, loculated right pleural effusion with mild pleural thickening, incompletely imaged. Associated compressive right lung atelectasis. Mild centrilobular emphysema. 12 x 9 mm part solid left upper lobe lung nodule (series 7, image 171), with only faint ground-glass  density on the prior chest CT. Review of the MIP images confirms the above findings CTA HEAD FINDINGS Anterior circulation: The internal carotid arteries are patent from skullbase to carotid termini with extensive siphon atherosclerosis bilaterally resulting in moderate to severe left and moderate right cavernous stenoses and moderate to severe right and moderate left para clinoid stenoses. The left M1 segment and MCA bifurcation are widely patent, however there is occlusion of the M2 superior division just beyond its origin. There is reconstitution of a diseased superior division branch vessel anteriorly in the sylvian fissure, however there is a decreased number of superior division distal branch vessels overall compared to the contralateral side. The right MCA and both ACAs are patent with moderate branch vessel atherosclerotic irregular narrowing but no evidence  of proximal branch occlusion or significant proximal stenosis. No aneurysm. Posterior circulation: Both V4 segments are small and partially obscured by metallic dental streak artifact, particularly on the left. The left V4 segment is occluded distal to the PICA origin. The right V4 segment is irregular with possible multiple severe stenoses. Right PICA origin is patent. The basilar artery is patent but extremely hypoplastic diffusely. There are fetal type origins of both PCAs, and there is a severe stenosis in the left P1-P2 junction region. There are also moderate tandem proximal left P2 stenoses, and there is asymmetric attenuation of distal left PCA branch vessels compared to the right. No significant proximal right PCA stenosis is seen. No aneurysm. Venous sinuses: Grossly patent. Anatomic variants: Fetal origins of the PCAs. Delayed phase: Not performed. Review of the MIP images confirms the above findings CT Brain Perfusion Findings: CBF (<30%) Volume: 0mL Perfusion (Tmax>6.0s) volume: 0mL (small amount of likely artifactual apparent prolonged transit in the cerebellum) Mismatch Volume: n/a Infarction Location: n/a IMPRESSION: 1. Proximal left M2 superior division occlusion with partial distal reconstitution. 2. Extensive ICA siphon atherosclerosis with moderate to severe stenoses bilaterally. 3. 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 Occlusion of the nondominant left vertebral artery distal to PICA. Possible severe right V4 stenoses, not well evaluated. 4. Moderate to severe proximal left PCA stenoses. 5. Patent cervical carotid arteries without significant stenosis. Prior right carotid endarterectomy. 6. Aortic Atherosclerosis (ICD10-I70.0) and Emphysema (ICD10-J43.9). 7. Moderate-sized, loculated right pleural effusion with pleural thickening suggesting chronicity. 8. 11 mm part solid pulmonary nodule in the left upper lobe. Follow-up non-contrast CT recommended at 3-6  months to confirm persistence. If unchanged, and solid component remains <6 mm, annual CT is recommended until 5 years of stability has been established. If persistent these nodules should be considered highly suspicious if the solid component of the nodule is 6 mm or greater in size and enlarging. This recommendation follows the consensus statement: Guidelines for Management of Incidental Pulmonary Nodules Detected on CT Images: From the Fleischner Society 2017; Radiology 2017; 284:228-243. Study reviewed in person with Dr. Laurence Slate on 11-27-2017 at 3:27 p.m. Electronically Signed   By: Sebastian Ache M.D.   On: 2017-11-27 16:13   Ct Head Wo Contrast  Result Date: 11-27-2017 CLINICAL DATA:  Right-sided weakness and slurred speech. Left M2 occlusion on CTA. Status post tPA in ER. Status post cerebral angiogram without vessel occlusion. EXAM: CT HEAD WITHOUT CONTRAST TECHNIQUE: Contiguous axial images were obtained from the base of the skull through the vertex without intravenous contrast. COMPARISON:  Head CT 11-27-2017 at 1453 hours FINDINGS: Brain: Mildly asymmetric hypodensity in the posterior limb of the left internal capsule is similar to the earlier CT.  There is no evidence of new/ expanding infarct, intracranial hemorrhage, mass, midline shift, or extra-axial fluid collection. There is mild cerebral atrophy. Mild bilateral cerebral white matter hypoattenuation is unchanged and may reflect mild chronic small vessel ischemia. Vascular: Calcified atherosclerosis at the skullbase. Residual intravascular contrast. Skull: No fracture or focal osseous lesion. Sinuses/Orbits: Mild mucosal thickening in the paranasal sinuses, greatest in the bilateral ethmoid air cells. Clear mastoid air cells. Unremarkable orbits. Other: Partially visualized endotracheal tube. IMPRESSION: 1. No intracranial hemorrhage. 2. Unchanged appearance of the brain. No evidence of new or enlarging infarct. Electronically Signed   By: Sebastian Ache M.D.   On: 06-Dec-2017 20:46   Ct Angio Neck W Or Wo Contrast  Result Date: 2017/12/06 CLINICAL DATA:  Right-sided weakness and slurred speech. EXAM: CT ANGIOGRAPHY HEAD AND NECK CT PERFUSION BRAIN TECHNIQUE: Multidetector CT imaging of the head and neck was performed using the standard protocol during bolus administration of intravenous contrast. Multiplanar CT image reconstructions and MIPs were obtained to evaluate the vascular anatomy. Carotid stenosis measurements (when applicable) are obtained utilizing NASCET criteria, using the distal internal carotid diameter as the denominator. Multiphase CT imaging of the brain was performed following IV bolus contrast injection. Subsequent parametric perfusion maps were calculated using RAPID software. CONTRAST:  90mL ISOVUE-370 IOPAMIDOL (ISOVUE-370) INJECTION 76% COMPARISON:  Chest CT 07/06/2015. FINDINGS: CTA NECK FINDINGS Aortic arch: Normal variant 4 vessel aortic arch with the left vertebral artery arising just proximal to the left subclavian artery. Widely patent arch vessel origins. Mild nonstenotic plaque in both subclavian arteries. Right carotid system: Patent with evidence of prior endarterectomy. Mild luminal irregularity of the common carotid artery and carotid bulb without significant stenosis. Left carotid system: Patent with mild, predominantly calcified plaque about the carotid bifurcation without evidence of significant stenosis or dissection. Vertebral arteries: Patent with the right being dominant. Calcified plaque near both vertebral artery origins without associated stenosis. Skeleton: Mild cervical disc and facet degeneration. Other neck: No mass or lymph node enlargement. Upper chest: Moderate-sized, loculated right pleural effusion with mild pleural thickening, incompletely imaged. Associated compressive right lung atelectasis. Mild centrilobular emphysema. 12 x 9 mm part solid left upper lobe lung nodule (series 7, image 171), with only  faint ground-glass density on the prior chest CT. Review of the MIP images confirms the above findings CTA HEAD FINDINGS Anterior circulation: The internal carotid arteries are patent from skullbase to carotid termini with extensive siphon atherosclerosis bilaterally resulting in moderate to severe left and moderate right cavernous stenoses and moderate to severe right and moderate left para clinoid stenoses. The left M1 segment and MCA bifurcation are widely patent, however there is occlusion of the M2 superior division just beyond its origin. There is reconstitution of a diseased superior division branch vessel anteriorly in the sylvian fissure, however there is a decreased number of superior division distal branch vessels overall compared to the contralateral side. The right MCA and both ACAs are patent with moderate branch vessel atherosclerotic irregular narrowing but no evidence of proximal branch occlusion or significant proximal stenosis. No aneurysm. Posterior circulation: Both V4 segments are small and partially obscured by metallic dental streak artifact, particularly on the left. The left V4 segment is occluded distal to the PICA origin. The right V4 segment is irregular with possible multiple severe stenoses. Right PICA origin is patent. The basilar artery is patent but extremely hypoplastic diffusely. There are fetal type origins of both PCAs, and there is a severe stenosis in the left P1-P2  junction region. There are also moderate tandem proximal left P2 stenoses, and there is asymmetric attenuation of distal left PCA branch vessels compared to the right. No significant proximal right PCA stenosis is seen. No aneurysm. Venous sinuses: Grossly patent. Anatomic variants: Fetal origins of the PCAs. Delayed phase: Not performed. Review of the MIP images confirms the above findings CT Brain Perfusion Findings: CBF (<30%) Volume: 0mL Perfusion (Tmax>6.0s) volume: 0mL (small amount of likely artifactual  apparent prolonged transit in the cerebellum) Mismatch Volume: n/a Infarction Location: n/a IMPRESSION: 1. Proximal left M2 superior division occlusion with partial distal reconstitution. 2. Extensive ICA siphon atherosclerosis with moderate to severe stenoses bilaterally. 3. 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 Occlusion of the nondominant left vertebral artery distal to PICA. Possible severe right V4 stenoses, not well evaluated. 4. Moderate to severe proximal left PCA stenoses. 5. Patent cervical carotid arteries without significant stenosis. Prior right carotid endarterectomy. 6. Aortic Atherosclerosis (ICD10-I70.0) and Emphysema (ICD10-J43.9). 7. Moderate-sized, loculated right pleural effusion with pleural thickening suggesting chronicity. 8. 11 mm part solid pulmonary nodule in the left upper lobe. Follow-up non-contrast CT recommended at 3-6 months to confirm persistence. If unchanged, and solid component remains <6 mm, annual CT is recommended until 5 years of stability has been established. If persistent these nodules should be considered highly suspicious if the solid component of the nodule is 6 mm or greater in size and enlarging. This recommendation follows the consensus statement: Guidelines for Management of Incidental Pulmonary Nodules Detected on CT Images: From the Fleischner Society 2017; Radiology 2017; 284:228-243. Study reviewed in person with Dr. Laurence Slate on 2017/11/30 at 3:27 p.m. Electronically Signed   By: Sebastian Ache M.D.   On: 2017-11-30 16:13   Mr Brain Wo Contrast  Result Date: 11/26/2017 CLINICAL DATA:  72 y/o  M; stroke for follow-up, tPA given. EXAM: MRI HEAD WITHOUT CONTRAST TECHNIQUE: Multiplanar, multiecho pulse sequences of the brain and surrounding structures were obtained without intravenous contrast. COMPARISON:  11/25/2016 CT head, CT perfusion head, CT angiogram head. FINDINGS: Brain: Reduced diffusion within left posterior limb of  internal capsule extending into left anterior parahippocampal gyrus and hippocampus (series 3, image 20 11-2025). Additional punctate focus of reduced diffusion in left inferior cerebellar hemisphere (series 3, image 13). Background of few nonspecific foci of T2 FLAIR hyperintense signal abnormality in subcortical and periventricular white matter are compatible with mild chronic microvascular ischemic changes for age. Mild brain parenchymal volume loss. Small hemosiderin stained chronic infarct within the left middle cerebellar peduncle. No hemorrhage or mass effect. No extra-axial collection, hydrocephalus, or effacement of basilar cisterns. Vascular: Normal flow voids. Skull and upper cervical spine: Normal marrow signal. Sinuses/Orbits: Mild diffuse paranasal sinus mucosal thickening. No abnormal signal of mastoid air cells. Orbits are unremarkable. Other: None. IMPRESSION: 1. Acute/early subacute infarction involving left posterior limb of internal capsule extending into left anterior parahippocampal gyrus and hippocampus. Additional punctate infarct within left inferior cerebellar hemisphere. No hemorrhage or mass effect. 2. Background of mild chronic microvascular ischemic changes and mild parenchymal volume loss of the brain. 3. 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 4. Mild paranasal sinus disease. Electronically Signed   By: Mitzi Hansen M.D.   On: 11/26/2017 00:05   Ct Cerebral Perfusion W Contrast  Result Date: 11/30/17 CLINICAL DATA:  Right-sided weakness and slurred speech. EXAM: CT ANGIOGRAPHY HEAD AND NECK CT PERFUSION BRAIN TECHNIQUE: Multidetector CT imaging of the head and neck was performed using the standard protocol  during bolus administration of intravenous contrast. Multiplanar CT image reconstructions and MIPs were obtained to evaluate the vascular anatomy. Carotid stenosis measurements (when applicable) are obtained utilizing NASCET criteria, using the  distal internal carotid diameter as the denominator. Multiphase CT imaging of the brain was performed following IV bolus contrast injection. Subsequent parametric perfusion maps were calculated using RAPID software. CONTRAST:  90mL ISOVUE-370 IOPAMIDOL (ISOVUE-370) INJECTION 76% COMPARISON:  Chest CT 07/06/2015. FINDINGS: CTA NECK FINDINGS Aortic arch: Normal variant 4 vessel aortic arch with the left vertebral artery arising just proximal to the left subclavian artery. Widely patent arch vessel origins. Mild nonstenotic plaque in both subclavian arteries. Right carotid system: Patent with evidence of prior endarterectomy. Mild luminal irregularity of the common carotid artery and carotid bulb without significant stenosis. Left carotid system: Patent with mild, predominantly calcified plaque about the carotid bifurcation without evidence of significant stenosis or dissection. Vertebral arteries: Patent with the right being dominant. Calcified plaque near both vertebral artery origins without associated stenosis. Skeleton: Mild cervical disc and facet degeneration. Other neck: No mass or lymph node enlargement. Upper chest: Moderate-sized, loculated right pleural effusion with mild pleural thickening, incompletely imaged. Associated compressive right lung atelectasis. Mild centrilobular emphysema. 12 x 9 mm part solid left upper lobe lung nodule (series 7, image 171), with only faint ground-glass density on the prior chest CT. Review of the MIP images confirms the above findings CTA HEAD FINDINGS Anterior circulation: The internal carotid arteries are patent from skullbase to carotid termini with extensive siphon atherosclerosis bilaterally resulting in moderate to severe left and moderate right cavernous stenoses and moderate to severe right and moderate left para clinoid stenoses. The left M1 segment and MCA bifurcation are widely patent, however there is occlusion of the M2 superior division just beyond its  origin. There is reconstitution of a diseased superior division branch vessel anteriorly in the sylvian fissure, however there is a decreased number of superior division distal branch vessels overall compared to the contralateral side. The right MCA and both ACAs are patent with moderate branch vessel atherosclerotic irregular narrowing but no evidence of proximal branch occlusion or significant proximal stenosis. No aneurysm. Posterior circulation: Both V4 segments are small and partially obscured by metallic dental streak artifact, particularly on the left. The left V4 segment is occluded distal to the PICA origin. The right V4 segment is irregular with possible multiple severe stenoses. Right PICA origin is patent. The basilar artery is patent but extremely hypoplastic diffusely. There are fetal type origins of both PCAs, and there is a severe stenosis in the left P1-P2 junction region. There are also moderate tandem proximal left P2 stenoses, and there is asymmetric attenuation of distal left PCA branch vessels compared to the right. No significant proximal right PCA stenosis is seen. No aneurysm. Venous sinuses: Grossly patent. Anatomic variants: Fetal origins of the PCAs. Delayed phase: Not performed. Review of the MIP images confirms the above findings CT Brain Perfusion Findings: CBF (<30%) Volume: 0mL Perfusion (Tmax>6.0s) volume: 0mL (small amount of likely artifactual apparent prolonged transit in the cerebellum) Mismatch Volume: n/a Infarction Location: n/a IMPRESSION: 1. Proximal left M2 superior division occlusion with partial distal reconstitution. 2. Extensive ICA siphon atherosclerosis with moderate to severe stenoses bilaterally. 3. 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 Occlusion of the nondominant left vertebral artery distal to PICA. Possible severe right V4 stenoses, not well evaluated. 4. Moderate to severe proximal left PCA stenoses. 5. Patent cervical  carotid arteries without significant  stenosis. Prior right carotid endarterectomy. 6. Aortic Atherosclerosis (ICD10-I70.0) and Emphysema (ICD10-J43.9). 7. Moderate-sized, loculated right pleural effusion with pleural thickening suggesting chronicity. 8. 11 mm part solid pulmonary nodule in the left upper lobe. Follow-up non-contrast CT recommended at 3-6 months to confirm persistence. If unchanged, and solid component remains <6 mm, annual CT is recommended until 5 years of stability has been established. If persistent these nodules should be considered highly suspicious if the solid component of the nodule is 6 mm or greater in size and enlarging. This recommendation follows the consensus statement: Guidelines for Management of Incidental Pulmonary Nodules Detected on CT Images: From the Fleischner Society 2017; Radiology 2017; 284:228-243. Study reviewed in person with Dr. Laurence Slate on 12/25/2017 at 3:27 p.m. Electronically Signed   By: Sebastian Ache M.D.   On: 12-25-17 16:13   Dg Chest Port 1 View  Result Date: 12-25-17 CLINICAL DATA:  Ventilator dependent EXAM: PORTABLE CHEST 1 VIEW COMPARISON:  07/06/2015 FINDINGS: Endotracheal tube is in place, tip 7.0 cm above the carina. Heart size is accentuated by the technique. There is a loculated right pleural effusion. There is dense opacity in the retrocardiac region on the right, consistent with effusion and/or infiltrate. The left lung is essentially clear. There is pulmonary vascular congestion. Remote right fractures. IMPRESSION: Right pleural effusion and lung opacity, consistent with atelectasis or consolidation. Electronically Signed   By: Norva Pavlov M.D.   On: 12/25/2017 21:37   Ct Head Code Stroke Wo Contrast  Result Date: 2017/12/25 CLINICAL DATA:  Code stroke. RIGHT-sided weakness and slurred speech. EXAM: CT HEAD WITHOUT CONTRAST TECHNIQUE: Contiguous axial images were obtained from the base of the skull through the vertex without intravenous  contrast. COMPARISON:  None. FINDINGS: Brain: Subtle asymmetric hypoattenuation in the region of the LEFT posterior limb internal capsule could represent developing infarction or represent an old insult; see image 19 series 3. Otherwise no evidence for acute infarct, hemorrhage, mass lesion, hydrocephalus, or extra-axial fluid. Premature for age atrophy. Hypoattenuation of white matter of a relatively modest nature, could represent small vessel disease. 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 Vascular: Calcification of the cavernous internal carotid arteries and distal vertebral arteries, consistent with cerebrovascular atherosclerotic disease. No signs of intracranial large vessel occlusion. Skull: Calvarium intact. Sinuses/Orbits: No acute finding. Other: None ASPECTS (Alberta Stroke Program Early CT Score) - Ganglionic level infarction (caudate, lentiform nuclei, internal capsule, insula, M1-M3 cortex): 6 - Supraganglionic infarction (M4-M6 cortex): 3 Total score (0-10 with 10 being normal): 9 IMPRESSION: 1. Atrophy and small vessel disease. Cannot exclude early LEFT posterior limb internal capsule infarct. Chronic insult LEFT cerebellum. 2. ASPECTS is 9. These results were communicated to Dr. Laurence Slate At 3:12 pmon 2019/02/05by text page via the [facility] messaging system. Electronically Signed   By: Elsie Stain M.D.   On: 2017-12-25 15:15      DISCUSSION: This is a 72 year old hypertensive diabetic who is suffered from a CVA.  Initial CTA showed a right M3 occlusion and he was taken to angiography after he had received TPA.Marland Kitchen  Apparently the thrombus is already cleared and no intervention was performed.  He remains intubated and mechanically ventilated this morning.  ASSESSMENT / PLAN:  PULMONARY A: He is intubated essentially for airway protection.  I am decreasing his sedation this morning and anticipate a spontaneous breathing trial in the next several  hours.  CARDIOVASCULAR A: He is requiring a slight dose of levo fed to support his blood  pressure which I suspect is secondary to his sedation.  We will need to reevaluate if the need for Levophed persist once the fentanyl is discontinued. NEUROLOGIC A: CVA with right hemiparesis.  He is still less than 24 hours out from receiving TPA therefore we are not administering antiplatelet agents or prophylactic heparin.  Echocardiogram is pending.  Further orders per neurology.  32 minutes was spent in the care of this patient today.  Penny PiaWJ Gray, MD Pulmonary and Critical Care Medicine [facility] Pager: [phone]  11/26/2017, 8:08 AM

## 2017-11-26 NOTE — Progress Notes (Signed)
SLP Cancellation Note  Patient Details Name: Todd [name] MRN: [id number] DOB: 08/05/1946   Cancelled treatment:       Reason Eval/Treat Not Completed: Patient not medically ready   [name], Riley NearingBonnie Caroline 11/26/2017, 7:24 AM

## 2017-11-26 NOTE — Procedures (Signed)
Extubation Procedure Note  Patient Details:   Name: Eunice BlaseWilliam B Nie DOB: 05/08/1946 MRN: [id number]   Airway Documentation:     Evaluation  O2 sats: stable throughout Complications: No apparent complications Patient did tolerate procedure well. Bilateral Breath Sounds: Coarse crackles   No   Patient extubated to 4 LNC per MD order. Tolerated well, but is not coughing or speaking. SATS stable. Responds to name, but not verbal. Falls asleep quickly and snores, family stated this is normal. RT to monitor as needed.  Lurlean LeydenDick, Shonnie Poudrier Bailey 11/26/2017, 12:12 PM

## 2017-11-26 NOTE — Progress Notes (Signed)
Neuro IR Rt 5 french femoral sheath removed at 0851. Sheath intact.  VPAD and manual pressure applied to achieve hemostasis at 0904.  Distal pulses intact.  No acute complications.  Site reviewed with Medical laboratory scientific officerJosh RN.   Josh Honeywellnderson Navarre Diana

## 2017-11-26 NOTE — Progress Notes (Signed)
PT Cancellation Note  Patient Details Name: Todd [name] MRN: [id number] DOB: 04/27/1946   Cancelled Treatment:    Reason Eval/Treat Not Completed: Medical issues which prohibited therapy(pt with strict bedrest order and await increased activity order)   Nurah Petrides B Marton Malizia 11/26/2017, 7:10 AM Delaney MeigsMaija Tabor Marieann Zipp, PT [phone]

## 2017-11-26 NOTE — Progress Notes (Signed)
  Echocardiogram 2D Echocardiogram has been performed.  Todd [name], Todd [name] F 11/26/2017, 3:21 PM

## 2017-11-27 ENCOUNTER — Encounter (HOSPITAL_COMMUNITY): Payer: Self-pay | Admitting: Interventional Radiology

## 2017-11-27 DIAGNOSIS — Z9911 Dependence on respirator [ventilator] status: Secondary | ICD-10-CM

## 2017-11-27 LAB — GLUCOSE, CAPILLARY
GLUCOSE-CAPILLARY: 183 mg/dL — AB (ref 65–99)
GLUCOSE-CAPILLARY: 159 mg/dL — AB (ref 65–99)
Glucose-Capillary: 149 mg/dL — ABNORMAL HIGH (ref 65–99)
Glucose-Capillary: 183 mg/dL — ABNORMAL HIGH (ref 65–99)
Glucose-Capillary: 177 mg/dL — ABNORMAL HIGH (ref 65–99)
Glucose-Capillary: 201 mg/dL — ABNORMAL HIGH (ref 65–99)

## 2017-11-27 LAB — BASIC METABOLIC PANEL
ANION GAP: 15 (ref 5–15)
BUN: 17 mg/dL (ref 6–20)
CALCIUM: 9.2 mg/dL (ref 8.9–10.3)
CO2: 19 mmol/L — AB (ref 22–32)
Chloride: 106 mmol/L (ref 101–111)
Creatinine, Ser: 0.69 mg/dL (ref 0.61–1.24)
GFR calc Af Amer: >60 mL/min (ref >60)
GFR calc non Af Amer: >60 mL/min (ref >60)
GLUCOSE: 151 mg/dL — AB (ref 65–99)
Potassium: 3.8 mmol/L (ref 3.5–5.1)
Sodium: 140 mmol/L (ref 135–145)

## 2017-11-27 LAB — ECHOCARDIOGRAM COMPLETE
HEIGHTINCHES: 72 in
WEIGHTICAEL: 3597.91 [oz_av]

## 2017-11-27 LAB — MAGNESIUM: Magnesium: 1.8 mg/dL (ref 1.7–2.4)

## 2017-11-27 MED ORDER — IPRATROPIUM-ALBUTEROL 0.5-2.5 (3) MG/3ML IN SOLN
3.0000 mL | Freq: Four times a day (QID) | RESPIRATORY_TRACT | Status: DC
  Administered 2017-11-27 (×2): 3 mL via RESPIRATORY_TRACT
  Filled 2017-11-27 (×2): qty 3

## 2017-11-27 MED ORDER — HYDRALAZINE HCL 20 MG/ML IJ SOLN
10.0000 mg | INTRAMUSCULAR | Status: AC | PRN
  Administered 2017-11-27 – 2017-11-28 (×2): 10 mg via INTRAVENOUS
  Filled 2017-11-27 (×2): qty 1

## 2017-11-27 MED ORDER — BUDESONIDE 0.25 MG/2ML IN SUSP
0.2500 mg | Freq: Two times a day (BID) | RESPIRATORY_TRACT | Status: DC
  Administered 2017-11-27 – 2017-11-28 (×3): 0.25 mg via RESPIRATORY_TRACT
  Filled 2017-11-27 (×4): qty 2

## 2017-11-27 MED ORDER — ALBUTEROL SULFATE (2.5 MG/3ML) 0.083% IN NEBU
2.5000 mg | INHALATION_SOLUTION | RESPIRATORY_TRACT | Status: DC | PRN
  Administered 2017-11-27 – 2017-11-28 (×2): 2.5 mg via RESPIRATORY_TRACT
  Filled 2017-11-27: qty 3

## 2017-11-27 MED ORDER — FOLIC ACID 5 MG/ML IJ SOLN
1.0000 mg | Freq: Every day | INTRAMUSCULAR | Status: DC
  Administered 2017-11-27 – 2017-11-28 (×2): 1 mg via INTRAVENOUS
  Filled 2017-11-27 (×3): qty 0.2

## 2017-11-27 MED ORDER — DEXMEDETOMIDINE HCL IN NACL 200 MCG/50ML IV SOLN
0.2000 ug/kg/h | INTRAVENOUS | Status: DC
  Administered 2017-11-27: 0.5 ug/kg/h via INTRAVENOUS
  Administered 2017-11-27: 0.4 ug/kg/h via INTRAVENOUS
  Administered 2017-11-28: 0.9 ug/kg/h via INTRAVENOUS
  Administered 2017-11-28: 0.8 ug/kg/h via INTRAVENOUS
  Administered 2017-11-28: 1.2 ug/kg/h via INTRAVENOUS
  Administered 2017-11-28: 0.7 ug/kg/h via INTRAVENOUS
  Administered 2017-11-28: 0.6 ug/kg/h via INTRAVENOUS
  Administered 2017-11-28: 0.5 ug/kg/h via INTRAVENOUS
  Administered 2017-11-28 – 2017-11-29 (×2): 0.6 ug/kg/h via INTRAVENOUS
  Administered 2017-11-29: 0.4 ug/kg/h via INTRAVENOUS
  Administered 2017-11-29: 0.6 ug/kg/h via INTRAVENOUS
  Administered 2017-11-29: 0.2 ug/kg/h via INTRAVENOUS
  Administered 2017-11-29 (×2): 0.6 ug/kg/h via INTRAVENOUS
  Administered 2017-11-29: 0.8 ug/kg/h via INTRAVENOUS
  Administered 2017-11-30: 0.7 ug/kg/h via INTRAVENOUS
  Administered 2017-11-30: 0.5 ug/kg/h via INTRAVENOUS
  Administered 2017-11-30: 0.6 ug/kg/h via INTRAVENOUS
  Administered 2017-11-30: 0.5 ug/kg/h via INTRAVENOUS
  Administered 2017-11-30: 0.6 ug/kg/h via INTRAVENOUS
  Administered 2017-11-30 – 2017-12-01 (×2): 0.7 ug/kg/h via INTRAVENOUS
  Administered 2017-12-01 (×2): 0.5 ug/kg/h via INTRAVENOUS
  Administered 2017-12-01: 0.7 ug/kg/h via INTRAVENOUS
  Administered 2017-12-01: 0.4 ug/kg/h via INTRAVENOUS
  Administered 2017-12-01 – 2017-12-02 (×2): 0.8 ug/kg/h via INTRAVENOUS
  Administered 2017-12-02: 0.5 ug/kg/h via INTRAVENOUS
  Administered 2017-12-02: 0.502 ug/kg/h via INTRAVENOUS
  Administered 2017-12-02: 0.5 ug/kg/h via INTRAVENOUS
  Filled 2017-11-27 (×21): qty 50
  Filled 2017-11-27: qty 100
  Filled 2017-11-27 (×10): qty 50

## 2017-11-27 MED ORDER — THIAMINE HCL 100 MG/ML IJ SOLN
100.0000 mg | Freq: Every day | INTRAMUSCULAR | Status: DC
  Administered 2017-11-27 – 2017-11-28 (×2): 100 mg via INTRAVENOUS
  Filled 2017-11-27 (×2): qty 2

## 2017-11-27 MED ORDER — ALBUTEROL SULFATE (2.5 MG/3ML) 0.083% IN NEBU
INHALATION_SOLUTION | RESPIRATORY_TRACT | Status: AC
  Filled 2017-11-27: qty 3

## 2017-11-27 NOTE — Progress Notes (Signed)
PT Cancellation Note  Patient Details Name: Eunice BlaseWilliam B Ludlum MRN: [id number] DOB: 05/16/1946   Cancelled Treatment:    Reason Eval/Treat Not Completed: Medical issues which prohibited therapy(pt continues to have bedrest order and await increased activity order)   Eletha Culbertson B Jalicia Roszak 11/27/2017, 7:07 AM  Delaney MeigsMaija Tabor Malaky Tetrault, PT [phone]

## 2017-11-27 NOTE — Progress Notes (Signed)
Received order to begin Precedex as part of the CIWA protocol. Will titrate medication to a dose that controls patient's sxs of agitation and hallucinations.

## 2017-11-27 NOTE — Care Management Note (Signed)
Case Management Note  Patient Details  Name: Eunice BlaseWilliam B Etheredge MRN: [id number] Date of Birth: 10/07/1946  Subjective/Objective:   72 yo admitted with right weakness and fall at home, received tPA and found to have left hippocampal and cerebellar infarct.   PTA, pt independent and living at home alone.  Nephew is POA, per report.                  Action/Plan: PT/OT recommending SNF at discharge.  CSW consulted to facilitate dc to SNF upon medical stability.  Will follow progress.   Expected Discharge Date:                  Expected Discharge Plan:  Skilled Nursing Facility  In-House Referral:  Clinical Social Work  Discharge planning Services  CM Consult  Post Acute Care Choice:    Choice offered to:     DME Arranged:    DME Agency:     HH Arranged:    HH Agency:     Status of Service:  In process, will continue to follow  If discussed at Long Length of Stay Meetings, dates discussed:    Additional Comments:  Quintella BatonJulie W. Shadee Montoya, RN, BSN  Trauma/Neuro ICU Case Manager [phone]

## 2017-11-27 NOTE — Progress Notes (Signed)
NEUROHOSPITALISTS STROKE TEAM - DAILY PROGRESS NOTE   ADMISSION HISTORY: Todd [name] is an 72 y.o. male right-handed past medical history of carotid stenosis, CHF, diabetes, hyperlipidemia, hypertension, obesity, peripheral arterial disease on aspirin presents with right sided weakness that began at 1 PM today. The patient notices right arm and leg becoming weak and had a fall around 1 PM. He waited for an hour but his symptoms did not improve and called 911. On arrival EMS noticed a right arm drift, facial droop, slurred speech. His blood pressure was 160/90. On arrival at [facility] ER, symptoms have gotten worse and patient plegic on both right arm and right leg, left gaze preference and dysarthria. However was noticed the patient was not severely aphasic on arrival. CT head was negative for hemorrhage and patient received TPA. He denies any history of hematuria/ GI bleeding/recent surgery. CT angiogram and CT perfusion were obtained. CT perfusion showed no perfusion deficit, however CT angiogram showed a M2 occlusion. Given patient's right-sided deficits, gaze deviation patient was deemed a candidate for thrombectomy. Absence of perfusion deficit in the left MCA, no significant aphasia- consider doing a stat MRI however this was delayed life-saving treatment and decided to proceed to angio suite.   Date last known well: 1.6.19 Time last known well: 1pm tPA Given: yes NIHSS: 17 Baseline MRS 0  SUBJECTIVE (INTERVAL HISTORY) Sister and his nephew are at the bedside. Patient is found laying in bed in NAD. Sister voices no new complaints. No new/acute events reported overnight. Patient was extubated yesterday. Family state he has history of heavy alcohol abuse. He lives alone and will likely need nursing home placement. Patient was extubated yesterday and is breathing okay so far   OBJECTIVE Lab Results: CBC:  Recent Labs  Lab 12/17/2017 1441  11/24/2017 1454 11/26/17 0524  WBC 10.1  --  10.4  HGB 15.3 15.3 13.7  HCT 44.8 45.0 41.4  MCV 89.8  --  90.8  PLT 248  --  268   BMP: Recent Labs  Lab 12/16/2017 1441 12/02/2017 1454 11/26/17 0524 11/26/17 1703 11/27/17 0434  NA 135 139 138  --  140  K 3.3* 3.4* 2.9* 4.5 3.8  CL 102 102 103  --  106  CO2 25  --  25  --  19*  GLUCOSE 190* 192* 188*  --  151*  BUN 13 14 16   --  17  CREATININE 0.76 0.70 0.87  --  0.69  CALCIUM 9.5  --  8.5*  --  9.2  MG  --   --  1.8  --  1.8  PHOS  --   --  4.2  --   --    Liver Function Tests:  Recent Labs  Lab 12/01/2017 1441  AST 20  ALT 19  ALKPHOS 73  BILITOT 0.7  PROT 7.4  ALBUMIN 3.4*   Thyroid Function Studies:  Recent Labs    11/26/17 0830  TSH 1.212   Coagulation Studies:  Recent Labs    11/24/2017 1441  APTT 26  INR 1.10   PHYSICAL EXAM Temp:  [97.5 F (36.4 C)-98.7 F (37.1 C)] 97.5 F (36.4 C) (01/08 1200) Pulse Rate:  [50-71] 50 (01/08 1500) Resp:  [14-28] 18 (01/08 1500) BP: (138-193)/(62-112) 138/62 (01/08 1500) SpO2:  [93 %-100 %] 99 % (01/08 1500) General - Well nourished, well developed, in no apparent distress,  HEENT-  Normocephalic, Normal external eye/conjunctiva.  Normal external ears. Normal external nose, mucus membranes and septum.  Cardiovascular - Regular rate and rhythm  Respiratory - Lungs clear bilaterally. No wheezing. Abdomen - soft and non-tender, BS normal Extremities- no edema or cyanosis Neurological Examination Mental Status: Intubated and sedated. Nonverbal. Follows only very simple commands. Cranial Nerves: II: Visual fields : Chronic right eye blindness , left homonymous hemianopsia  III,IV, VI: ptosis not present, forced deviation to left side. pupils equal, round, reactive to light and accommodation V,VII RIGHT FACIAL DROOP  VIII: hearing normal bilaterally IX,X: uvula rises symmetrically XI: bilateral shoulder shrug XII: midline tongue extension Motor: Right :  Upper  extremity   0/5                                      Left:     Upper extremity   5/5             Lower extremity   0/5                                                  Lower extremity   5/5 Tone and bulk:normal tone throughout; no atrophy noted Sensory: Reduced sensation on right side , +neglect  Plantars: Right: downgoing                                Left: downgoing Gait:Not tested  IMAGING: I have personally reviewed the radiological images below and agree with the radiology interpretations. Ct Angio Head and Neck W Or Wo Contrast with Perfusion Result Date: Dec 03, 2017 IMPRESSION: 1. Proximal left M2 superior division occlusion with partial distal reconstitution. 2. Extensive ICA siphon atherosclerosis with moderate to severe stenoses bilaterally. 3. 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 Occlusion of the nondominant left vertebral artery distal to PICA. Possible severe right V4 stenoses, not well evaluated. 4. Moderate to severe proximal left PCA stenoses. 5. Patent cervical carotid arteries without significant stenosis. Prior right carotid endarterectomy. 6. Aortic Atherosclerosis (ICD10-I70.0) and Emphysema (ICD10-J43.9). 7. Moderate-sized, loculated right pleural effusion with pleural thickening suggesting chronicity. 8. 11 mm part solid pulmonary nodule in the left upper lobe.  Ct Head Wo Contrast Result Date: 11/23/2017 IMPRESSION: 1. No intracranial hemorrhage. 2. Unchanged appearance of the brain. No evidence of new or enlarging infarct.   Mr Brain Wo Contrast Result Date: 11/26/2017 IMPRESSION: 1. Acute/early subacute infarction involving left posterior limb of internal capsule extending into left anterior parahippocampal gyrus and hippocampus. Additional punctate infarct within left inferior cerebellar hemisphere. No hemorrhage or mass effect. 2. Background of mild chronic microvascular ischemic changes and mild parenchymal volume loss of the brain. 3.  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 4. Mild paranasal sinus disease.   Dg Chest Port 1 View Result Date: Dec 03, 2017 IMPRESSION: Right pleural effusion and lung opacity, consistent with atelectasis or consolidation.  Ct Head Code Stroke Wo Contrast Result Date: 12/09/2017 IMPRESSION: 1. Atrophy and small vessel disease. Cannot exclude early LEFT posterior limb internal capsule infarct. Chronic insult LEFT cerebellum. 2. ASPECTS is 9.   Echocardiogram:  PENDING     IMPRESSION: Mr. AARONJAMES KELSAY is a 72 y.o. male with PMH of carotid stenosis, CHF, diabetes, hyperlipidemia, hypertension, obesity, peripheral arterial disease on aspirin presents with acute onset of right sided weakness, which progressed to result in hemiplegia.  CT head was negative for hemorrhage and patient received IV tPA. CT perfusion showed no perfusion deficit, however CT angiogram showed a M2 occlusion. Given patient's right-sided deficits, gaze deviation patient was deemed a candidate for thrombectomy. MRI reveals:  Acute/early subacute infarction Left posterior limb of internal capsule extending into left anterior parahippocampal gyrus and hippocampus Infarct within Left inferior cerebellar hemisphere.  Treated with IV TPA STROKE:  Suspected Etiology: Left M2 occlusion and  possibly complication of catheter angiogram  Resultant Symptoms: right-sided deficits, gaze deviation  Stroke Risk Factors: diabetes mellitus, hyperlipidemia and hypertension Other Stroke Risk Factors: Advanced age, Cigarette smoker, ETOH use, Obesity, Body mass index is 30.5 kg/m. , Hx stroke, OSA/likely undx, CHF, PAD  PROCEDURES: 12/16/2017  Dr Corliss Skains S/P 4 vessel cerebral arteriogram Rt CFA approach. Findings. 1.No occlusions,stenosis or dissections extracranially or intracranially. Occluded Rt common femoral  Below the inguinal ligament  With distal recostitution of the  profuda from prominent collaterals and  brisk run off.         Echocardiogram:                                                    Left ventricle:  Inferobasal hypokinesis.  Wall thickness was increased in a pattern of mild LVH.   Systolic function was normal. The estimated ejection fraction was in the range of 50% to 55%. Doppler parameters are consistent with both elevated ventricular end-diastolic filling pressure and elevated left atrial filling pressure.  11/27/2017 ASSESSMENT:   Patient remains intubated and sedated.  Following only minimal commands.  Nonverbal.  No movement of right side.  CCM following.  Appreciate assistance.  Plan for extubation later today.  We will continue to follow closely.  PLAN  11/27/2017: Continue Statin Start ASA  Frequent neuro checks Telemetry monitoring PT/OT/SLP Consult PM & Rehab Consult Case Management /MSW Ongoing aggressive stroke risk factor management Patient will be counseled to be compliant with his antithrombotic medications Patient will be counseled on Lifestyle modifications including, Diet, Exercise, and Stress Follow up with [facility] in 6 weeks  HX OF STROKES: Small chronic infarct within left middle cerebellar peduncle  INTRACRANIAL Atherosclerosis &Stenosis: May consider DAPT prior to discharge  DYSPHAGIA: NPO until passes SLP swallow evaluation Aspiration Precautions in progress  Medical issues per CCM team Intubated post procedure Hypotension Hypokalemia Hypocalcemia  Pulmonary nodule 11 mm part solid pulmonary nodule in the left upper lobe Outpatient follow-up CT recommended at 3-6 months to confirm persistence If unchanged, and solid component remains <6 mm, annual CT until 5 years of stability  If persistent these nodules should be considered highly suspicious if the solid component of the nodule is 6 mm or greater in size and enlarging.   HYPERTENSION: Stable SBP goal of < 180. DBP goal of <  105.  Nicardipine drip, Labetolol PRN Long term BP goal normotensive. May slowly start B/P medications after 48 hours, if applicable Home Meds: NONE  HYPERLIPIDEMIA:    Component Value Date/Time   CHOL 168 11/26/2017 0524   TRIG 96 11/26/2017 0524  HDL 44 11/26/2017 0524   CHOLHDL 3.8 11/26/2017 0524   VLDL 19 11/26/2017 0524   LDLCALC 105 (H) 11/26/2017 0524  Home Meds:  NONE LDL  goal < 70 Will started on  Lipitor to 40 mg daily, once patient passes SLP evaluation Continue statin at discharge  DIABETES: Lab Results  Component Value Date   HGBA1C 7.6 (H) 11/26/2017  HgbA1c goal < 7.0 Currently on: NovoLog Continue CBG monitoring and SSI to maintain glucose 140-180 mg/dl DM education   TOBACCO ABUSE and Likely ETOH Abuse Current smoker Smoking cessation counseling provided Nicotine patch provided CIWA, as needed  OBESITY Obesity, Body mass index is 30.5 kg/m. Greater than/equal to 30  Other Active Problems: Active Problems:   Stroke [facility](HCC)    Hospital day # 2 VTE prophylaxis: SCD's  Diet : Diet NPO time specified   FAMILY UPDATES: family at bedside  TEAM UPDATES: Micki RileySethi, Devri Kreher S, MD STATUS:  FULL   Prior Home Stroke Medications:  aspirin 81 mg daily  Discharge Stroke Meds:  Please discharge patient on TBD   Disposition:  Therapy Recs:               PENDING Home Equipment:         PENDING Follow Up:  Follow-up Information    Micki RileySethi, Kyrra Prada S, MD. Schedule an appointment as soon as possible for a visit in 6 week(s).   Specialties:  Neurology, Radiology Contact information: [address] [place] [address] [phone]          Alysia PennaHolwerda, Scott, MD -PCP Follow up in 1-2 weeks     Assessment & plan discussed with with attending physician and they are in agreement.    Beryl MeagerMary A Costello, ANP-C Stroke Neurology Team 11/27/2017 4:00 PM I have personally examined this patient, reviewed notes, independently viewed imaging studies,  participated in medical decision making and plan of care.ROS completed by me personally and pertinent positives fully documented  I have made any additions or clarifications directly to the above note. Agree with note above.. Recommend mobilize out of bed and Speech therapy and physical occupational therapy consult     Long discussion with patient's sister at the bedside and answered questions about his care. Wean and discontinue norepinephrine drip and change blood pressure parameters to systolic blood pressure goal below 180. Discuss with critical care attending. Patient will likely need watch for alcohol withdrawal precautions. This patient is critically ill and at significant risk of neurological worsening, death and care requires constant monitoring of vital signs, hemodynamics,respiratory and cardiac monitoring, extensive review of multiple databases, frequent neurological assessment, discussion with family, other specialists and medical decision making of high complexity.I have made any additions or clarifications directly to the above note.This critical care time does not reflect procedure time, or teaching time or supervisory time of PA/NP/Med Resident etc but could involve care discussion time.  I spent 32 minutes of neurocritical care time  in the care of  this patient.     Delia HeadyPramod Aby Gessel, MD Medical Director [facility] Pager: [phone] 11/27/2017 4:00 PM  To contact Stroke Continuity provider, please refer to [email or web address]. After hours, contact General Neurology

## 2017-11-27 NOTE — Progress Notes (Signed)
Patient is becoming more agitated. Trying environmental changes (dim lights, repositioning) prior to requesting medication for withdrawal-like symptoms.

## 2017-11-27 NOTE — Evaluation (Signed)
Clinical/Bedside Swallow Evaluation Patient Details  Name: Todd [name] MRN: [id number] Date of Birth: 08-12-1946  Today's Date: 11/27/2017 Time: SLP Start Time (ACUTE ONLY): 1019 SLP Stop Time (ACUTE ONLY): 1030 SLP Time Calculation (min) (ACUTE ONLY): 11 min  Past Medical History:  Past Medical History:  Diagnosis Date  . 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 Carotid stenosis   . CHF (congestive heart failure) (HCC)   . Depression   . Diabetes mellitus without complication (HCC)   . Hyperlipidemia   . Hypertension   . Obesity   . Peripheral arterial disease (HCC)   . Polyuria    Past Surgical History:  Past Surgical History:  Procedure Laterality Date  . CAROTID ENDARTERECTOMY    . RADIOLOGY WITH ANESTHESIA N/A 11/29/17   Procedure: RADIOLOGY WITH ANESTHESIA;  Surgeon: Julieanne Cotton, MD;  Location: [facility] OR;  Service: Radiology;  Laterality: N/A;   HPI:  72 y.o. male with PMHx carotid stenosis, CHF, DM, HTN, PAD presented with right sided weakness, left gaze preference, dysarthria. tPA administered. Underwent 4-vessel cerebral arteriogram. 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 Intubated 1/06-07.    Assessment / Plan / Recommendation Clinical Impression  Pt presents with a neurogenic dysphagia marked by decreased oral sensation/awareness of bolus material, oral holding, max tactile/verbal prompting required to elicit a swallow response, immediate/consistent coughing s/p ice chips, sips of water.  Recommend continuing NPO for now; may need to consider temporary enteral feeding.  Discussed with pt's sister and nephew, who were at bedside.  SLP will follow for readiness.  SLP Visit Diagnosis: Dysphagia,  unspecified (R13.10)    Aspiration Risk       Diet Recommendation   npo      Other  Recommendations Oral Care Recommendations: Oral care QID   Follow up Recommendations Other (comment)(tba)      Frequency and Duration min 3x week  2 weeks       Prognosis Prognosis for Safe Diet Advancement: Good      Swallow Study   General HPI: 72 y.o. male with PMHx carotid stenosis, CHF, DM, HTN, PAD presented with right sided weakness, left gaze preference, dysarthria. tPA administered. Underwent 4-vessel cerebral arteriogram. 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 Intubated 1/06-07.  Type of Study: Bedside Swallow Evaluation Previous Swallow Assessment: no Diet Prior to this Study: NPO Temperature Spikes Noted: No Respiratory Status: Nasal cannula History of Recent Intubation: Yes Length of Intubations (days): 1 days Date extubated: 11/26/17 Behavior/Cognition: Lethargic/Drowsy Oral Cavity Assessment: Excessive secretions Oral Care Completed by SLP: Recent completion by staff Oral Cavity - Dentition: Adequate natural dentition Self-Feeding Abilities: Total assist Patient Positioning: Upright in bed Baseline Vocal Quality: Hoarse;Low vocal intensity Volitional Cough: Cognitively unable to elicit Volitional Swallow: Unable to elicit    Oral/Motor/Sensory Function Overall Oral Motor/Sensory Function: Moderate impairment Facial ROM: Suspected CN VII (facial) dysfunction;Reduced right Facial Symmetry: Suspected CN VII (facial) dysfunction;Abnormal symmetry right Facial Sensation: Reduced right;Suspected CN V (Trigeminal) dysfunction Lingual Symmetry: Abnormal symmetry right;Suspected CN XII (hypoglossal) dysfunction   Ice Chips Ice  chips: Impaired Presentation: Spoon Oral Phase Impairments: Poor awareness of bolus Oral  Phase Functional Implications: Prolonged oral transit;Oral holding Pharyngeal Phase Impairments: Multiple swallows;Cough - Immediate;Throat Clearing - Immediate   Thin Liquid Thin Liquid: Impaired Presentation: Spoon Oral Phase Functional Implications: Oral holding Pharyngeal  Phase Impairments: Multiple swallows;Cough - Immediate    Nectar Thick Nectar Thick Liquid: Not tested   Honey Thick Honey Thick Liquid: Not tested   Puree Puree: Not tested   Solid   GO   Solid: Not tested        Blenda Mountsouture, Nori Winegar Laurice 11/27/2017,10:47 AM

## 2017-11-27 NOTE — Evaluation (Signed)
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 Pt with decline since admit with HTN and bradycardia. Intubated 1/6-1/7. PMhx: carotid stenosis, CHF, DM, HTN, HLD, PAD, obesity  Clinical Impression  Pt with left gaze maintained, period of midline gaze when sitting and looking down at therapist. Pt with dense hemiplegia right side with right lean, impaired balance, cognition, aphasia and 2 person assist for all mobility. Pt will benefit from acute therapy to maximize mobility, function, strength, balance, and cognition to decrease burden of care.  Pt SpO2 96% on 3L. EOB grossly 10 min with assist for balance, direction to task and attention.      Follow Up Recommendations SNF;Supervision/Assistance - 24 hour    Equipment Recommendations  Wheelchair cushion (measurements PT);Wheelchair (measurements PT);Hospital bed    Recommendations for Other Services       Precautions / Restrictions Precautions Precautions: Fall Precaution Comments: right hemiplegia, left gaze      Mobility  Bed Mobility Overal bed mobility: Needs Assistance Bed Mobility: Supine to Sit;Sit to Supine     Supine to sit: Max assist;HOB elevated;+2 for physical assistance Sit to supine: +2 for physical assistance;Max assist   General bed mobility comments: pt with assist to bring legs off of bed, assist to elevate trunk from surface  Transfers Overall transfer level: Needs assistance   Transfers: Sit to/from Stand Sit to Stand: Max assist;+2 physical assistance         General transfer comment: max assist with right knee blocked and assist with belt and pad to stand from surface x 2 trials. Pt with physical assist to place RLE on ground as well as assist to rise. Assist to shift  [facility]   Ambulation/Gait             General Gait Details: unable  Stairs            Wheelchair Mobility    Modified Rankin (Stroke Patients Only)       Balance Overall balance assessment: Needs assistance Sitting-balance support: No upper extremity supported;Feet supported Sitting balance-Leahy Scale: Zero Sitting balance - Comments: pt pushing right with LUE and required assist to place LUE on lap to prevent pushing. Max assist for midline  Postural control: Right lateral lean;Posterior lean   Standing balance-Leahy Scale: Zero Standing balance comment: 2 person assist in standing for sacral and trunk support with right lean                             Pertinent Vitals/Pain Pain Assessment: (CPOT = 0) Faces Pain Scale: No hurt    Home Living Family/patient expects to be discharged to:: Skilled nursing facility Living Arrangements: Alone Available Help at Discharge: Family;Available PRN/intermittently Type of Home: Apartment Home Access: Level entry     Home Layout: One level Home Equipment: Cane - single point      Prior Function Level of Independence: Independent               Hand Dominance   Dominant Hand: Right    Extremity/Trunk Assessment   Upper Extremity Assessment Upper Extremity Assessment: Defer to OT evaluation    Lower Extremity Assessment Lower Extremity Assessment: RLE deficits/detail;Difficult to assess due to impaired cognition RLE Deficits / Details:  no AROM, pt not responding to touch and unable to state sensation    Cervical / Trunk Assessment Cervical / Trunk Assessment: Kyphotic;Other exceptions Cervical / Trunk Exceptions: pt with right posterior lean, rounded shoulders  Communication   Communication: Expressive difficulties;Receptive difficulties  Cognition Arousal/Alertness: Lethargic Behavior During Therapy: Flat affect;Impulsive Overall Cognitive Status: Impaired/Different from  baseline Area of Impairment: Memory;Following commands;Safety/judgement;Attention                   Current Attention Level: Focused   Following Commands: Follows one step commands with increased time;Follows one step commands inconsistently Safety/Judgement: Decreased awareness of safety     General Comments: Pt with limited verbalizations and limited command following for transfers      General Comments      Exercises     Assessment/Plan    PT Assessment Patient needs continued PT services  PT Problem List Decreased strength;Decreased mobility;Decreased safety awareness;Impaired tone;Decreased coordination;Decreased activity tolerance;Decreased cognition;Decreased balance;Impaired sensation       PT Treatment Interventions Therapeutic exercise;Patient/family education;Balance training;Functional mobility training;Neuromuscular re-education;DME instruction;Therapeutic activities;Cognitive remediation    PT Goals (Current goals can be found in the Care Plan section)  Acute Rehab PT Goals Patient Stated Goal: move more PT Goal Formulation: With family Time For Goal Achievement: 12/11/17 Potential to Achieve Goals: Fair    Frequency Min 3X/week   Barriers to discharge Decreased caregiver support      Co-evaluation PT/OT/SLP Co-Evaluation/Treatment: Yes Reason for Co-Treatment: Complexity of the patient's impairments (multi-system involvement);For patient/therapist safety           AM-PAC PT "6 Clicks" Daily Activity  Outcome Measure Difficulty turning over in bed (including adjusting bedclothes, sheets and blankets)?: Unable Difficulty moving from lying on back to sitting on the side of the bed? : Unable Difficulty sitting down on and standing up from a chair with arms (e.g., wheelchair, bedside commode, etc,.)?: Unable Help needed moving to and from a bed to chair (including a wheelchair)?: Total Help needed walking in hospital room?: Total Help needed  climbing 3-5 steps with a railing? : Total 6 Click Score: 6    End of Session Equipment Utilized During Treatment: Gait belt Activity Tolerance: Patient tolerated treatment well Patient left: in bed;with call bell/phone within reach;with bed alarm set;with family/visitor present Nurse Communication: Mobility status;Need for lift equipment PT Visit Diagnosis: Other abnormalities of gait and mobility (R26.89);Other symptoms and signs involving the nervous system (R29.898);Unsteadiness on feet (R26.81)    Time: 1610-9604 PT Time Calculation (min) (ACUTE ONLY): 43 min   Charges:   PT Evaluation $PT Eval Moderate Complexity: 1 Mod     PT G Codes:        Delaney Meigs, PT [phone]   Heath Tesler B Delmont Prosch 11/27/2017, 11:39 AM

## 2017-11-27 NOTE — Evaluation (Signed)
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 Pt with decline since admit with HTN and bradycardia. Intubated 1/6-1/7. PMhx: carotid stenosis, CHF, DM, HTN, HLD, PAD, obesity   Clinical Impression   PTA, pt was living alone and was independent. Pt currently requiring Max-Total A for ADLs and Max A +2 for sit<>stand transfer. Pt presenting with significant L gaze and head turn, hemiplegia of right side, decreased cognition, and poor sitting and standing balance. Pt would benefit form further acute OT to facilitate safe dc and increase occupational performance and participation. Recommend dc to SNF for further OT to increase safety and independence with ADLs and functional transfers as well as decrease caregiver burden.     Follow Up Recommendations  SNF;Supervision/Assistance - 24 hour    Equipment Recommendations  Other (comment)(Defer to next venue)    Recommendations for Other Services PT consult;Speech consult     Precautions / Restrictions Precautions Precautions: Fall Precaution Comments: right hemiplegia, left gaze Restrictions Weight Bearing Restrictions: No      Mobility Bed Mobility Overal bed mobility: Needs Assistance Bed Mobility: Supine to Sit;Sit to Supine     Supine to sit: Max assist;HOB elevated;+2 for physical assistance Sit to supine: +2 for physical assistance;Max assist   General bed mobility comments: pt with assist to bring legs off of bed, assist to elevate trunk from surface  Transfers Overall transfer level: Needs assistance   Transfers: Sit to/from Stand Sit to Stand: Max assist;+2 physical assistance         General transfer comment: max assist with right knee blocked and assist with belt and pad to stand from  surface x 2 trials. Pt with physical assist to place RLE on ground as well as assist to rise. Assist to shift weight toward HOB     Balance Overall balance assessment: Needs assistance Sitting-balance support: No upper extremity supported;Feet supported Sitting balance-Leahy Scale: Zero Sitting balance - Comments: pt pushing right with LUE and required assist to place LUE on lap to prevent pushing. Max assist for midline  Postural control: Right lateral lean;Posterior lean   Standing balance-Leahy Scale: Zero Standing balance comment: 2 person assist in standing for sacral and trunk support with right lean                           ADL either performed or assessed with clinical judgement   ADL Overall ADL's : Needs assistance/impaired     Grooming: Wash/dry face;Total assistance;Sitting Grooming Details (indicate cue type and reason): Max A to maintain sitting balance. Total A to perform grooming task. Able to grasp wash cloth but does not bring towards face Upper Body Bathing: Total assistance;Bed level   Lower Body Bathing: Total assistance;Bed level   Upper Body Dressing : Total assistance;Bed level   Lower Body Dressing: Total assistance;Bed level                 General ADL Comments: Pt requiring Max- total A for ADLs, bed mobility, and functional transfers. SpO2 96% on 3L. EOB grossly 10 min with Max A to maintain sitting and signfiicant R lateral lean     Vision Baseline Vision/History: No visual deficits Vision Assessment?: Yes;Vision impaired- to be further tested in functional context Ocular Range of  Motion: Other (comment)(Gaze L. able to reach midline for short time with Max cues) Alignment/Gaze Preference: Gaze left;Head tilt Additional Comments: Significant head turn adn gaze preference to L. Pt able to gaze toward midline but with significant amount of time and cues; only performs for short period of time     Perception     Praxis       Pertinent Vitals/Pain Pain Assessment: Faces Faces Pain Scale: No hurt Pain Intervention(s): Monitored during session     Hand Dominance Right   Extremity/Trunk Assessment Upper Extremity Assessment Upper Extremity Assessment: RUE deficits/detail RUE Deficits / Details: Brunstrom stage 1 with flacidity no AROM RUE Coordination: decreased gross motor;decreased fine motor   Lower Extremity Assessment Lower Extremity Assessment: Defer to PT evaluation RLE Deficits / Details: no AROM, pt not responding to touch and unable to state sensation   Cervical / Trunk Assessment Cervical / Trunk Assessment: Kyphotic;Other exceptions Cervical / Trunk Exceptions: pt with right posterior lean, rounded shoulders   Communication Communication Communication: Expressive difficulties;Receptive difficulties   Cognition Arousal/Alertness: Lethargic Behavior During Therapy: Flat affect;Impulsive Overall Cognitive Status: Impaired/Different from baseline Area of Impairment: Memory;Following commands;Safety/judgement;Attention;Awareness;Problem solving                   Current Attention Level: Focused   Following Commands: Follows one step commands with increased time;Follows one step commands inconsistently Safety/Judgement: Decreased awareness of safety Awareness: Intellectual Problem Solving: Slow processing;Difficulty sequencing;Requires verbal cues;Requires tactile cues;Decreased initiation General Comments: Pt answering with yes and no but only ~10% of time. Pt demonstrating poor awarness and follow of commands.As seen during grooming task, pt able to grasp wash clothe with max tactile cues and then would not bring wash cloth to face.    General Comments  Sister and nephew present throughout session    Exercises     Shoulder Instructions      Home Living Family/patient expects to be discharged to:: Skilled nursing facility Living Arrangements: Alone Available Help at  Discharge: Family;Available PRN/intermittently Type of Home: Apartment Home Access: Level entry     Home Layout: One level     Bathroom Shower/Tub: Chief Strategy OfficerTub/shower unit   Bathroom Toilet: Standard     Home Equipment: Cane - single point          Prior Functioning/Environment Level of Independence: Independent        Comments: ADLs and IADLs        OT Problem List: Decreased strength;Decreased range of motion;Decreased activity tolerance;Impaired balance (sitting and/or standing);Impaired vision/perception;Decreased coordination;Decreased cognition;Decreased safety awareness;Decreased knowledge of use of DME or AE;Impaired UE functional use      OT Treatment/Interventions: Self-care/ADL training;Therapeutic exercise;Energy conservation;DME and/or AE instruction;Therapeutic activities;Patient/family education    OT Goals(Current goals can be found in the care plan section) Acute Rehab OT Goals Patient Stated Goal: move more OT Goal Formulation: With family Time For Goal Achievement: 12/11/17 Potential to Achieve Goals: Good ADL Goals Pt Will Perform Grooming: with min assist;sitting(supported sitting) Pt Will Perform Upper Body Dressing: with min assist;sitting(supported sitting) Pt Will Transfer to Toilet: with mod assist;stand pivot transfer;bedside commode;with +2 assist Additional ADL Goal #1: Pt will maintain sitting for 5-7 minutes with Min A for support and balance during ADL Additional ADL Goal #2: Pt will locate 25% of items on R side during ADL task  OT Frequency: Min 2X/week   Barriers to D/C: Decreased caregiver support  Lives alone       Co-evaluation   Reason for Co-Treatment: Complexity of the  patient's impairments (multi-system involvement);For patient/therapist safety          AM-PAC PT "6 Clicks" Daily Activity     Outcome Measure Help from another person eating meals?: Total Help from another person taking care of personal grooming?: A  Lot Help from another person toileting, which includes using toliet, bedpan, or urinal?: Total Help from another person bathing (including washing, rinsing, drying)?: Total Help from another person to put on and taking off regular upper body clothing?: Total Help from another person to put on and taking off regular lower body clothing?: Total 6 Click Score: 7   End of Session Equipment Utilized During Treatment: Gait belt;Oxygen Nurse Communication: Mobility status  Activity Tolerance: Patient limited by lethargy;Patient limited by fatigue Patient left: in bed;with call bell/phone within reach;with bed alarm set;with family/visitor present  OT Visit Diagnosis: Unsteadiness on feet (R26.81);Other abnormalities of gait and mobility (R26.89);Muscle weakness (generalized) (M62.81);Hemiplegia and hemiparesis;Other symptoms and signs involving cognitive function Hemiplegia - Right/Left: Right Hemiplegia - caused by: Cerebral infarction                Time: 1610-9604 OT Time Calculation (min): 44 min Charges:  OT General Charges $OT Visit: 1 Visit OT Evaluation $OT Eval Moderate Complexity: 1 Mod OT Treatments $Self Care/Home Management : 8-22 mins G-Codes:     Cordae Mccarey MSOT, OTR/L Acute Rehab Pager: [phone] Office: [phone]  Theodoro Grist Harjit Leider 11/27/2017, 3:04 PM

## 2017-11-27 NOTE — Progress Notes (Signed)
PT/OT working with patient.

## 2017-11-27 NOTE — Progress Notes (Signed)
PULMONARY / CRITICAL CARE MEDICINE   Name: Todd [name] MRN: [id number] DOB: May 16, 1946    ADMISSION DATE:  11/24/2017   CHIEF COMPLAINT:  Code stroke  HISTORY OF PRESENT ILLNESS:        This is a 72 year old smoker was originally seen in consultation at the request of Dr. Farrel Demark for recommendation on further evaluation and management of CODE STROKE.  He presented with L gaze deviation and R facial droop and received tPA in the ED. CTA suggested a left M2 occlusion, and he was taken to angiography where no occlusion was found following the tPA. He arrived in the ICU hypertensive. He was given labetalol and became bradycardic into the 40's. Sedation was held to see if bradycardia might be secondary to an intracranial catastrophe. He was following instructions but not moving the right side. CT of the head did not show hemorrhage. He has intubated for airway protection.  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  PAST SURGICAL HISTORY: He  has a past surgical history that includes Carotid endarterectomy and Radiology with anesthesia (N/A, 12/20/2017).  No Known Allergies  No current facility-administered medications on file prior to encounter.    Current Outpatient Medications on File Prior to Encounter  Medication Sig  . amLODipine (NORVASC) 5 MG tablet Take 5 mg by mouth daily.  Marland Kitchen atorvastatin (LIPITOR) 20 MG tablet Take 20 mg by mouth daily.  . citalopram (CELEXA) 40 MG tablet Take 40 mg by mouth daily.  Marland Kitchen dicyclomine (BENTYL) 20 MG tablet Take 20 mg by mouth 2 (two) times daily.  . insulin lispro protamine-lispro (HUMALOG 75/25 MIX) (75-25) 100 UNIT/ML SUSP injection Inject 57 Units into the skin 2 (two)  times daily.  . methocarbamol (ROBAXIN) 500 MG tablet Take 500 mg by mouth every 8 (eight) hours as needed for muscle spasms.  Marland Kitchen oxybutynin (DITROPAN) 5 MG tablet Take 5 mg by mouth 2 (two) times daily.  Marland Kitchen oxyCODONE-acetaminophen (PERCOCET/ROXICET) 5-325 MG tablet Take 1 tablet by mouth every 8 (eight) hours as needed for severe pain.  . tamsulosin (FLOMAX) 0.4 MG CAPS capsule Take 0.8 mg by mouth daily after supper.   . traMADol (ULTRAM) 50 MG tablet Take 50-100 mg by mouth every 6 (six) hours as needed for moderate pain.  Marland Kitchen aspirin 81 MG tablet Take 81 mg by mouth daily.    REVIEW OF SYSTEMS:   Unobtainable  SUBJECTIVE: Extubated in the interim. Off fentanyl gtt. BP 176/82. Sister at bedside. Patient answers "yeah" to everything. Sister points out that the patient appears to be demonstrating increased work of breathing. R hemiparesis. R toes and feet flicker. Dense deficit in R UE. Still has L gaze preference.   VITAL SIGNS: BP (!) 178/75   Pulse 61   Temp 98.3 F (36.8 C) (Oral)   Resp 20   Ht 6' (1.829 m)   Wt 102 kg (224 lb 13.9 oz)   SpO2 96%   BMI 30.50 kg/m     VENTILATOR SETTINGS: liberated from vent  INTAKE / OUTPUT: I/O last 3 completed shifts: In: 3029.5 [I.V.:2629.5; IV Piggyback:400] Out: 525 [Urine:525]  PHYSICAL EXAMINATION: General: Awake. Alert. Unable to assess orientation due to dysphasia/aphasia. Neuro: L gaze preference. Pupils equal, 2 mm. Neck: Right  carotid endarterectomy scar.  There is no JVD CHEST: Symmetric in development and expansion. Diminished air entry. No crackles. B wheezes. Regular S1 and S2 without murmur, rub or gallop. ABDOMEN: obese, soft and nontende. no organomegaly, no icterus. EXTREMITIES: no dependent edema. No clubbing. No cyanosis.  LABS:  BMET Recent Labs  Lab 2018/08/07 1441 2018/08/07 1454 11/26/17 0524 11/26/17 1703 11/27/17 0434  NA 135 139 138  --  140  K 3.3* 3.4* 2.9* 4.5 3.8  CL 102 102 103  --  106  CO2 25   --  25  --  19*  BUN 13 14 16   --  17  CREATININE 0.76 0.70 0.87  --  0.69  GLUCOSE 190* 192* 188*  --  151*    Electrolytes Recent Labs  Lab 2018/08/07 1441 11/26/17 0524 11/27/17 0434  CALCIUM 9.5 8.5* 9.2  MG  --  1.8 1.8  PHOS  --  4.2  --     CBC Recent Labs  Lab 2018/08/07 1441 2018/08/07 1454 11/26/17 0524  WBC 10.1  --  10.4  HGB 15.3 15.3 13.7  HCT 44.8 45.0 41.4  PLT 248  --  268    Coag's Recent Labs  Lab 2018/08/07 1441  APTT 26  INR 1.10    Sepsis Markers No results for input(s): LATICACIDVEN, PROCALCITON, O2SATVEN in the last 168 hours.  ABG Recent Labs  Lab 2018/08/07 2014 2018/08/07 2135 11/26/17 0841  PHART 7.406 7.380 7.339*  PCO2ART 48.7* 47.9 47.9  PO2ART 65.0* 61.0* 80.0*    Liver Enzymes Recent Labs  Lab 2018/08/07 1441  AST 20  ALT 19  ALKPHOS 73  BILITOT 0.7  ALBUMIN 3.4*    Cardiac Enzymes No results for input(s): TROPONINI, PROBNP in the last 168 hours.  Glucose Recent Labs  Lab 11/26/17 1130 11/26/17 1544 11/26/17 1946 11/26/17 2303 11/27/17 0342 11/27/17 0747  GLUCAP 157* 174* 184* 181* 149* 183*    Imaging  Ct Head Wo Contrast  Result Date: 11/26/2017 CLINICAL DATA:  Stroke status post tPA 24 hours ago. EXAM: CT HEAD WITHOUT CONTRAST TECHNIQUE: Contiguous axial images were obtained from the base of the skull through the vertex without intravenous contrast. COMPARISON:  Brain MRI and CT 2018/10/29 FINDINGS: Brain: Low-density edema in the posterior limb of the left internal capsule has increased from the prior CT, consistent with an evolving acute infarct as seen on interval MRI. Infarct extends into the mesial left temporal lobe. The punctate acute left cerebellar infarct is not resolvable by CT. There is no evidence of acute intracranial hemorrhage or new infarct. Mild cerebral atrophy and mild chronic small vessel ischemic white matter disease are again noted as well as a chronic infarct in the left middle cerebellar  peduncle. Vascular: Calcified atherosclerosis at the skullbase. No hyperdense vessel. Skull: No fracture or focal osseous lesion. Sinuses/Orbits: Scattered mild mucosal thickening in the paranasal sinuses. Clear mastoid air cells. Unremarkable orbits. Other: None. IMPRESSION: Evolving acute infarct in the posterior limb of the left internal capsule and mesial left temporal lobe as seen on MRI. No hemorrhage. Electronically Signed   By: Sebastian AcheAllen  Grady M.D.   On: 11/26/2017 16:42    ECHO (11/26/2017) - Left ventricle: Inferobasal hypokinesis. Wall thickness was increased in a pattern of mild LVH. Systolic function was normal.  The estimated ejection fraction was in the range of 50% to 55%.  Doppler parameters are consistent with both elevated ventricular end-diastolic filling pressure and elevated left atrial filling pressure. - Left atrium:  The atrium was moderately dilated. - Atrial septum: Marked hypertrophy of the basal atrial septum. Likely lipomatous but consider TEE given setting of CVA. No defect or patent foramen ovale was identified.   DISCUSSION: This is a 72 year old hypertensive diabetic who is suffered from a CVA.  Initial CTA showed a right M3 occlusion and he was taken to angiography after he had received TPA.Marland Kitchen  Apparently the thrombus is already cleared and no intervention was performed.  He remains intubated and mechanically ventilated this morning.  ASSESSMENT / PLAN:  PULMONARY A: COPD with long-term tobacco abuse history Start DuoNeb/Pulmicort CXR in am  CARDIOVASCULAR A: HYPERTENSION Start Vasotec IV  NEUROLOGIC A: CVA with right hemiparesis.  > 24 hours out from receiving tPA. Echocardiogram is pending.  Further orders per neurology. Watch for alcohol/opiate withdrawal symptoms. Case discussed with Dr. Pearlean Brownie.  METABOLIC A: Hypokalemia Electrolyte replacement   Marcelle Smiling, MD Pulmonary and Critical Care Medicine [facility] Pager: [phone]  11/27/2017, 8:33 AM

## 2017-11-27 NOTE — Evaluation (Signed)
Speech Language Pathology Evaluation Patient Details Name: Todd [name] MRN: [id number] DOB: 10/04/1946 Today's Date: 11/27/2017 Time: [id number] SLP Time Calculation (min) (ACUTE ONLY): 11 min  Problem List:  Patient Active Problem List   Diagnosis Date Noted  . Stroke (HCC) 12/16/2017  . Bilateral carotid artery stenosis 08/28/2016  . Atherosclerosis of native arteries of extremity with intermittent claudication (HCC) 07/09/2015   Past Medical History:  Past Medical History:  Diagnosis Date  . 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 Carotid stenosis   . CHF (congestive heart failure) (HCC)   . Depression   . Diabetes mellitus without complication (HCC)   . Hyperlipidemia   . Hypertension   . Obesity   . Peripheral arterial disease (HCC)   . Polyuria    Past Surgical History:  Past Surgical History:  Procedure Laterality Date  . CAROTID ENDARTERECTOMY    . RADIOLOGY WITH ANESTHESIA N/A 11/29/2017   Procedure: RADIOLOGY WITH ANESTHESIA;  Surgeon: Julieanne Cottoneveshwar, Sanjeev, MD;  Location: [facility] OR;  Service: Radiology;  Laterality: N/A;   HPI:  72 y.o. male with PMHx carotid stenosis, CHF, DM, HTN, PAD presented with right sided weakness, left gaze preference, dysarthria. tPA administered. Underwent 4-vessel cerebral arteriogram. 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 Intubated 1/06-07.    Assessment / Plan / Recommendation Clinical Impression  Pt participated in limited speech/language assessment due to lethargy - able to state his name, days-of-week with cues to initiate.  Spontaneous speech limited to 1-2 word utterances; perseverative output noted.  Deficits in command-following and yes/no reliability noted.   Speech is dysarthric.  Strong left gaze preference. Recommend acute care SLP to address communication of basic wants/needs.  D/W pt's sister, nephew, who are in agreement.      SLP Assessment  SLP Recommendation/Assessment: Patient needs continued Speech Lanaguage Pathology Services SLP Visit Diagnosis: Dysphagia, unspecified (R13.10)    Follow Up Recommendations  Other (comment)(tba)    Frequency and Duration min 3x week  2 weeks      SLP Evaluation Cognition  Overall Cognitive Status: Impaired/Different from baseline Arousal/Alertness: Lethargic Orientation Level: Disoriented X4 Attention: Sustained Sustained Attention: Impaired       Comprehension  Auditory Comprehension Overall Auditory Comprehension: Impaired Yes/No Questions: Impaired Basic Biographical Questions: 26-50% accurate Commands: Impaired One Step Basic Commands: 25-49% accurate Reading Comprehension Reading Status: Not tested    Expression Expression Primary Mode of Expression: Verbal Verbal Expression Overall Verbal Expression: Impaired Initiation: Impaired Automatic Speech: Day of week Level of Generative/Spontaneous Verbalization: Word Repetition: Impaired Level of Impairment: Word level Naming: Impairment Written Expression Written Expression: Not tested   Oral / Motor  Oral Motor/Sensory Function Overall Oral Motor/Sensory Function: Moderate impairment Facial ROM: Suspected CN VII (facial) dysfunction;Reduced right Facial Symmetry: Suspected CN VII (facial) dysfunction;Abnormal symmetry right Facial Sensation: Reduced right;Suspected CN V (Trigeminal) dysfunction Lingual Symmetry: Abnormal symmetry right;Suspected CN XII (hypoglossal) dysfunction Motor Speech Overall Motor Speech: Impaired Respiration: Impaired Level of Impairment: Phrase Articulation: Impaired Level of Impairment: Phrase Intelligibility: Intelligibility reduced Phrase: 25-49% accurate   [name]                     Blenda MountsCouture, Todd Winthrop Laurice 11/27/2017, 10:55 AM   Todd [name], [place] CCC/SLP Pager [phone]

## 2017-11-28 ENCOUNTER — Inpatient Hospital Stay (HOSPITAL_COMMUNITY): Payer: Medicare Other

## 2017-11-28 DIAGNOSIS — E1149 Type 2 diabetes mellitus with other diabetic neurological complication: Secondary | ICD-10-CM | POA: Diagnosis present

## 2017-11-28 DIAGNOSIS — E118 Type 2 diabetes mellitus with unspecified complications: Secondary | ICD-10-CM

## 2017-11-28 DIAGNOSIS — Z9911 Dependence on respirator [ventilator] status: Secondary | ICD-10-CM

## 2017-11-28 DIAGNOSIS — R0603 Acute respiratory distress: Secondary | ICD-10-CM

## 2017-11-28 DIAGNOSIS — I10 Essential (primary) hypertension: Secondary | ICD-10-CM | POA: Diagnosis present

## 2017-11-28 DIAGNOSIS — J441 Chronic obstructive pulmonary disease with (acute) exacerbation: Secondary | ICD-10-CM | POA: Diagnosis not present

## 2017-11-28 DIAGNOSIS — J449 Chronic obstructive pulmonary disease, unspecified: Secondary | ICD-10-CM | POA: Diagnosis not present

## 2017-11-28 DIAGNOSIS — F10231 Alcohol dependence with withdrawal delirium: Secondary | ICD-10-CM | POA: Diagnosis not present

## 2017-11-28 DIAGNOSIS — R9389 Abnormal findings on diagnostic imaging of other specified body structures: Secondary | ICD-10-CM | POA: Diagnosis not present

## 2017-11-28 DIAGNOSIS — Z794 Long term (current) use of insulin: Secondary | ICD-10-CM

## 2017-11-28 DIAGNOSIS — I63312 Cerebral infarction due to thrombosis of left middle cerebral artery: Secondary | ICD-10-CM

## 2017-11-28 DIAGNOSIS — F10931 Alcohol use, unspecified with withdrawal delirium: Secondary | ICD-10-CM | POA: Diagnosis not present

## 2017-11-28 LAB — CBC WITH DIFFERENTIAL/PLATELET
BASOS PCT: 0 %
Basophils Absolute: 0 10*3/uL (ref 0.0–0.1)
EOS PCT: 0 %
Eosinophils Absolute: 0 10*3/uL (ref 0.0–0.7)
HEMATOCRIT: 43.1 % (ref 39.0–52.0)
Hemoglobin: 14.4 g/dL (ref 13.0–17.0)
Lymphocytes Relative: 11 %
Lymphs Abs: 1.5 10*3/uL (ref 0.7–4.0)
MCH: 31.1 pg (ref 26.0–34.0)
MCHC: 33.4 g/dL (ref 30.0–36.0)
MCV: 93.1 fL (ref 78.0–100.0)
MONO ABS: 1.1 10*3/uL — AB (ref 0.1–1.0)
Monocytes Relative: 9 %
NEUTROS ABS: 10.3 10*3/uL — AB (ref 1.7–7.7)
Neutrophils Relative %: 80 %
PLATELETS: 247 10*3/uL (ref 150–400)
RBC: 4.63 MIL/uL (ref 4.22–5.81)
RDW: 13.7 % (ref 11.5–15.5)
WBC: 13 10*3/uL — ABNORMAL HIGH (ref 4.0–10.5)

## 2017-11-28 LAB — GLUCOSE, CAPILLARY
GLUCOSE-CAPILLARY: 205 mg/dL — AB (ref 65–99)
GLUCOSE-CAPILLARY: 184 mg/dL — AB (ref 65–99)
GLUCOSE-CAPILLARY: 159 mg/dL — AB (ref 65–99)
GLUCOSE-CAPILLARY: 148 mg/dL — AB (ref 65–99)
Glucose-Capillary: 160 mg/dL — ABNORMAL HIGH (ref 65–99)
Glucose-Capillary: 126 mg/dL — ABNORMAL HIGH (ref 65–99)

## 2017-11-28 LAB — COMPREHENSIVE METABOLIC PANEL
ALBUMIN: 3 g/dL — AB (ref 3.5–5.0)
ALK PHOS: 76 U/L (ref 38–126)
ALT: 40 U/L (ref 17–63)
ANION GAP: 9 (ref 5–15)
AST: 37 U/L (ref 15–41)
BILIRUBIN TOTAL: 1.1 mg/dL (ref 0.3–1.2)
BUN: 17 mg/dL (ref 6–20)
CALCIUM: 9.1 mg/dL (ref 8.9–10.3)
CO2: 22 mmol/L (ref 22–32)
Chloride: 108 mmol/L (ref 101–111)
Creatinine, Ser: 0.68 mg/dL (ref 0.61–1.24)
GLUCOSE: 159 mg/dL — AB (ref 65–99)
POTASSIUM: 3.3 mmol/L — AB (ref 3.5–5.1)
Sodium: 139 mmol/L (ref 135–145)
TOTAL PROTEIN: 6.6 g/dL (ref 6.5–8.1)

## 2017-11-28 LAB — POCT I-STAT 3, ART BLOOD GAS (G3+)
ACID-BASE DEFICIT: 2 mmol/L (ref 0.0–2.0)
Acid-base deficit: 1 mmol/L (ref 0.0–2.0)
BICARBONATE: 21.7 mmol/L (ref 20.0–28.0)
BICARBONATE: 24.3 mmol/L (ref 20.0–28.0)
O2 SAT: 96 %
O2 Saturation: 100 %
PCO2 ART: 40.3 mmHg (ref 32.0–48.0)
PO2 ART: 239 mmHg — AB (ref 83.0–108.0)
Patient temperature: 98.6
TCO2: 23 mmol/L (ref 22–32)
TCO2: 25 mmol/L (ref 22–32)
pCO2 arterial: 32.8 mmHg (ref 32.0–48.0)
pH, Arterial: 7.431 (ref 7.350–7.450)
pH, Arterial: 7.388 (ref 7.350–7.450)
pO2, Arterial: 78 mmHg — ABNORMAL LOW (ref 83.0–108.0)

## 2017-11-28 LAB — MAGNESIUM
MAGNESIUM: 1.6 mg/dL — AB (ref 1.7–2.4)
Magnesium: 1.7 mg/dL (ref 1.7–2.4)

## 2017-11-28 LAB — BASIC METABOLIC PANEL
Anion gap: 9 (ref 5–15)
BUN: 17 mg/dL (ref 6–20)
CALCIUM: 9.2 mg/dL (ref 8.9–10.3)
CO2: 24 mmol/L (ref 22–32)
CREATININE: 0.7 mg/dL (ref 0.61–1.24)
Chloride: 108 mmol/L (ref 101–111)
GFR calc Af Amer: >60 mL/min (ref >60)
Glucose, Bld: 192 mg/dL — ABNORMAL HIGH (ref 65–99)
Potassium: 3.7 mmol/L (ref 3.5–5.1)
Sodium: 141 mmol/L (ref 135–145)

## 2017-11-28 LAB — PHOSPHORUS: PHOSPHORUS: 2.2 mg/dL — AB (ref 2.5–4.6)

## 2017-11-28 LAB — LACTIC ACID, PLASMA: Lactic Acid, Venous: 1.4 mmol/L (ref 0.5–1.9)

## 2017-11-28 LAB — BRAIN NATRIURETIC PEPTIDE: B Natriuretic Peptide: 397.6 pg/mL — ABNORMAL HIGH (ref 0.0–100.0)

## 2017-11-28 MED ORDER — FUROSEMIDE 10 MG/ML IJ SOLN
40.0000 mg | Freq: Once | INTRAMUSCULAR | Status: AC
  Administered 2017-11-28: 40 mg via INTRAVENOUS
  Filled 2017-11-28: qty 4

## 2017-11-28 MED ORDER — LORAZEPAM 2 MG/ML IJ SOLN
1.0000 mg | INTRAMUSCULAR | Status: DC | PRN
  Administered 2017-11-28: 2 mg via INTRAVENOUS
  Filled 2017-11-28: qty 1

## 2017-11-28 MED ORDER — PHENOBARBITAL SODIUM 130 MG/ML IJ SOLN
130.0000 mg | INTRAMUSCULAR | Status: DC | PRN
  Administered 2017-11-28: 130 mg via INTRAVENOUS
  Filled 2017-11-28: qty 1

## 2017-11-28 MED ORDER — IPRATROPIUM-ALBUTEROL 0.5-2.5 (3) MG/3ML IN SOLN
3.0000 mL | Freq: Three times a day (TID) | RESPIRATORY_TRACT | Status: DC
  Administered 2017-11-28 (×2): 3 mL via RESPIRATORY_TRACT
  Filled 2017-11-28 (×3): qty 3

## 2017-11-28 MED ORDER — BUDESONIDE 0.5 MG/2ML IN SUSP
0.5000 mg | Freq: Two times a day (BID) | RESPIRATORY_TRACT | Status: DC
  Administered 2017-11-29 – 2017-12-02 (×7): 0.5 mg via RESPIRATORY_TRACT
  Filled 2017-11-28 (×7): qty 2

## 2017-11-28 MED ORDER — POTASSIUM CHLORIDE 10 MEQ/100ML IV SOLN
10.0000 meq | INTRAVENOUS | Status: AC
  Administered 2017-11-28 – 2017-11-29 (×4): 10 meq via INTRAVENOUS
  Filled 2017-11-28 (×4): qty 100

## 2017-11-28 MED ORDER — ENALAPRILAT 1.25 MG/ML IV SOLN
1.2500 mg | Freq: Four times a day (QID) | INTRAVENOUS | Status: DC
  Administered 2017-11-28 – 2017-12-01 (×13): 1.25 mg via INTRAVENOUS
  Filled 2017-11-28 (×13): qty 1

## 2017-11-28 MED ORDER — IPRATROPIUM-ALBUTEROL 0.5-2.5 (3) MG/3ML IN SOLN
3.0000 mL | RESPIRATORY_TRACT | Status: DC
  Administered 2017-11-28 – 2017-12-01 (×17): 3 mL via RESPIRATORY_TRACT
  Filled 2017-11-28 (×18): qty 3

## 2017-11-28 MED ORDER — PHENOBARBITAL SODIUM 130 MG/ML IJ SOLN
260.0000 mg | INTRAMUSCULAR | Status: DC | PRN
  Administered 2017-11-28: 260 mg via INTRAVENOUS
  Filled 2017-11-28: qty 2

## 2017-11-28 NOTE — Progress Notes (Signed)
Cortrak Tube Team Note:  Consult received to place a Cortrak feeding tube. This RD arrived to pt's room around 10 AM. Before tube placement could be attempted, pt became very agitated, O2 sats dropped to mid-80s on room air. RN placed pt on [place] and sats came up to 90. RN discussed with MD and verbal order given to increase Precedex drip. RN and MD decided to give pt a break and plan to hold off on Cortrak placement at that time. RN reported she was leaving at 1 PM and that another RN would take over pt's care at that time. RD asked RN about plan for communication concerning Cortrak and RN informed RD that RN would page RD should pt become appropriate later in the day for Cortrak placement. No page has been received up to this time (4:10 PM).       Trenton GammonJessica Barron Vanloan, MS, RD, LDN, [facility] Inpatient Clinical Dietitian Pager # [phone] After hours/weekend pager # [phone]

## 2017-11-28 NOTE — Progress Notes (Addendum)
PULMONARY / CRITICAL CARE MEDICINE   Name: Todd [name] MRN: [id number] DOB: July 05, 1946    ADMISSION DATE:  December 08, 2017   CHIEF COMPLAINT:  Code stroke  HISTORY OF PRESENT ILLNESS:        This is a 72 year old smoker was originally seen in consultation at the request of Dr. Farrel Demark for recommendation on further evaluation and management of CODE STROKE.  He presented with L gaze deviation and R facial droop and received tPA in the ED. CTA suggested a left M2 occlusion, and he was taken to angiography where no occlusion was found following the tPA. He arrived in the ICU hypertensive. He was given labetalol and became bradycardic into the 40's. Sedation was held to see if bradycardia might be secondary to an intracranial catastrophe. He was following instructions but not moving the right side. CT of the head did not show hemorrhage. He has intubated for airway protection. On 1/8, started showing symptoms suspicious for alcohol withdrawal (with family providing history of regular EtOH consumption at baseline).  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  PAST SURGICAL HISTORY: He  has a past surgical history that includes Carotid endarterectomy; Radiology with anesthesia (N/A, 08-Dec-2017); IR ANGIO INTRA EXTRACRAN SEL COM CAROTID INNOMINATE BILAT MOD SED (12-08-17); IR ANGIO VERTEBRAL SEL VERTEBRAL UNI L MOD SED (2017-12-08); and IR ANGIO VERTEBRAL SEL SUBCLAVIAN INNOMINATE UNI R MOD SED (08-Dec-2017).  No Known Allergies  No current facility-administered medications on file prior to encounter.    Current Outpatient Medications on File Prior to Encounter  Medication Sig  . amLODipine (NORVASC) 5 MG tablet Take 5 mg by mouth  daily.  Marland Kitchen atorvastatin (LIPITOR) 20 MG tablet Take 20 mg by mouth daily.  . citalopram (CELEXA) 40 MG tablet Take 40 mg by mouth daily.  Marland Kitchen dicyclomine (BENTYL) 20 MG tablet Take 20 mg by mouth 2 (two) times daily.  . insulin lispro protamine-lispro (HUMALOG 75/25 MIX) (75-25) 100 UNIT/ML SUSP injection Inject 57 Units into the skin 2 (two) times daily.  . methocarbamol (ROBAXIN) 500 MG tablet Take 500 mg by mouth every 8 (eight) hours as needed for muscle spasms.  Marland Kitchen oxybutynin (DITROPAN) 5 MG tablet Take 5 mg by mouth 2 (two) times daily.  Marland Kitchen oxyCODONE-acetaminophen (PERCOCET/ROXICET) 5-325 MG tablet Take 1 tablet by mouth every 8 (eight) hours as needed for severe pain.  . tamsulosin (FLOMAX) 0.4 MG CAPS capsule Take 0.8 mg by mouth daily after supper.   . traMADol (ULTRAM) 50 MG tablet Take 50-100 mg by mouth every 6 (six) hours as needed for moderate pain.  Marland Kitchen aspirin 81 MG tablet Take 81 mg by mouth daily.    REVIEW OF SYSTEMS:   Unobtainable  SUBJECTIVE: started on aerosols yesterday for presumptive treatment of COPD. Also started on Precedex gtt in the interim for suspected EtOH withdrawal. On Precedex @ 0.7. Went back on Cardene overnight for blood pressure control. Currently off Cardene, 164/72. Remains extubated. Somnolent. Opens eyes to noxious stimuli. Continues to have R hemiparesis. R toes and feet flicker. Dense deficit in R UE. Still has L gaze preference.   VITAL SIGNS: BP (!) 176/79   Pulse (!) 53   Temp 98 F (36.7 C) (Axillary)   Resp (!) 25  Ht 6' (1.829 m)   Wt 102 kg (224 lb 13.9 oz)   SpO2 99%   BMI 30.50 kg/m     VENTILATOR SETTINGS: liberated from vent  INTAKE / OUTPUT: I/O last 3 completed shifts: In: 2885.1 [I.V.:2885.1] Out: 1050 [Urine:1050]  PHYSICAL EXAMINATION: General: Somnolent. Unable to assess orientation due to dysphasia/aphasia. Neuro: L gaze preference. Pupils equal, 2 mm. R [place]. DTR 1+ @ RUE, 2+ @ LUE, 1+ @ RLE, 1+ @ LLE. Neck:  Right carotid endarterectomy scar.  There is no JVD CHEST: Symmetric in development and expansion. Improved air entry. No crackles. B wheezes. Regular S1 and S2 without murmur, rub or gallop. ABDOMEN: obese, soft and nontender. no organomegaly, no icterus. EXTREMITIES: no dependent edema. No clubbing. No cyanosis.  LABS:  BMET Recent Labs  Lab Apr 05, 2018 1441 Apr 05, 2018 1454 11/26/17 0524 11/26/17 1703 11/27/17 0434  NA 135 139 138  --  140  K 3.3* 3.4* 2.9* 4.5 3.8  CL 102 102 103  --  106  CO2 25  --  25  --  19*  BUN 13 14 16   --  17  CREATININE 0.76 0.70 0.87  --  0.69  GLUCOSE 190* 192* 188*  --  151*    Electrolytes Recent Labs  Lab Apr 05, 2018 1441 11/26/17 0524 11/27/17 0434  CALCIUM 9.5 8.5* 9.2  MG  --  1.8 1.8  PHOS  --  4.2  --     CBC Recent Labs  Lab Apr 05, 2018 1441 Apr 05, 2018 1454 11/26/17 0524  WBC 10.1  --  10.4  HGB 15.3 15.3 13.7  HCT 44.8 45.0 41.4  PLT 248  --  268    Coag's Recent Labs  Lab Apr 05, 2018 1441  APTT 26  INR 1.10    Sepsis Markers No results for input(s): LATICACIDVEN, PROCALCITON, O2SATVEN in the last 168 hours.  ABG Recent Labs  Lab Apr 05, 2018 2014 Apr 05, 2018 2135 11/26/17 0841  PHART 7.406 7.380 7.339*  PCO2ART 48.7* 47.9 47.9  PO2ART 65.0* 61.0* 80.0*    Liver Enzymes Recent Labs  Lab Apr 05, 2018 1441  AST 20  ALT 19  ALKPHOS 73  BILITOT 0.7  ALBUMIN 3.4*    Cardiac Enzymes No results for input(s): TROPONINI, PROBNP in the last 168 hours.  Glucose Recent Labs  Lab 11/27/17 1207 11/27/17 1514 11/27/17 1936 11/27/17 2306 11/28/17 0321 11/28/17 0734  GLUCAP 177* 201* 183* 159* 205* 184*    Imaging  No results found.  ECHO (11/26/2017) - Left ventricle: Inferobasal hypokinesis. Wall thickness was increased in a pattern of mild LVH. Systolic function was normal.  The estimated ejection fraction was in the range of 50% to 55%.  Doppler parameters are consistent with both elevated ventricular end-diastolic  filling pressure and elevated left atrial filling pressure. - Left atrium: The atrium was moderately dilated. - Atrial septum: Marked hypertrophy of the basal atrial septum. Likely lipomatous but consider TEE given setting of CVA. No defect or patent foramen ovale was identified.   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 Got tPA. Angiographically, no evidence of thrombus after tPA. No additional intervention was performed. Liberated from ventilator 11/26/2017.  ASSESSMENT / PLAN:  PULMONARY A: COPD with long-term tobacco abuse history ABNORMAL CXR with R basilar consolidation Continue DuoNeb/Pulmicort Keep HOB elevated 30+ degrees Airway care Place feeding tube to start nutrition. Dietary consult for tube feed recommendations. CBC, CXR in am  CARDIOVASCULAR A: HYPERTENSION Start Vasotec IV  NEUROLOGIC A: CVA (L M2 occlusion) with  right hemiparesis.  > 48 hours out from receiving tPA. Alcohol withdrawal/Delirium tremens Further orders per Neurology. Wean Precedex as tolerated. Target: RASS 0. Will transition to phenobarbital monotherapy for alcohol withdrawal. No benzodiazepines.   METABOLIC A: type 2 diabetes with neurologic complications Hypokalemia On sliding scale insulin. Check BMP, Mg.   Marcelle Smiling, MD Pulmonary and Critical Care Medicine [facility] Pager: [phone]  11/28/2017, 8:19 AM

## 2017-11-28 NOTE — Progress Notes (Signed)
  Speech Language Pathology Treatment: Dysphagia  Patient Details Name: Todd [name] MRN: [id number] DOB: 04/20/1946 Today's Date: 11/28/2017 Time: [id number] SLP Time Calculation (min) (ACUTE ONLY): 10 min  Assessment / Plan / Recommendation Clinical Impression  Pt more lethargic today - max tactile/verbal cues/stimulation required to elicit eye opening.  Oral care provided; spontaneous swallowing observed, but no POs offered due to MS.  Pt not following commands, stated name when asked, dysarthria present.  Has difficulty managing secretions.  Recommend continued NPO; orders have been written for cortrak per MD.  SLP will continue to follow for dysphagia, communication.  HPI HPI: 72 y.o. male with PMHx carotid stenosis, CHF, DM, HTN, PAD presented with right sided weakness, left gaze preference, dysarthria. tPA administered. Underwent 4-vessel cerebral arteriogram. 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 Intubated 1/06-07.       SLP Plan  Continue with current plan of care       Recommendations  Diet recommendations: NPO                Oral Care Recommendations: Oral care QID Plan: Continue with current plan of care       GO               Masa Lubin L. Samson Fredericouture, [place] CCC/SLP Pager [phone]  Blenda MountsCouture, Kiwanna Spraker Laurice 11/28/2017, 9:41 AM

## 2017-11-28 NOTE — Progress Notes (Signed)
Pt BP was increasing, PRNs given, eventually restarted Cardene drip. Pt very agitated and combative. Order to restrain pts Left side received. Precedex restarted. This RN concerned about declining respiratory status- tachypnea, inspiratory and expiratory wheezing with rhonchi. RT involved, and administered PRN breathing treatment with some relief.

## 2017-11-28 NOTE — Progress Notes (Signed)
Called emergently to evaluate patient for possible need for intubation. At time of my exam, RR 35-40 with accessory muscle use. Lungs CTA anteriorly, slightly coarse posteriorly b/l. BP 194/83, Pox 100% on NRB. ABG shows no hypercapnea or hypoxia. Trace pedal edema. TTE from 1/7 shows EF 50-55% with elevated end-diastolic filling pressures. CXR on my review shows right base effusion (chronic seen on CT Chest from 2016) and now new increased interstitial markings. I am concerned he is in CHF exacerbation. Stopped IVF's. Will order BNP. Give Lasix 40mg  IV once. Hypokalemia (K 3.3) noted on labs from this evening and has not been repleted. Will give 40MEQ KCL IV; check Mg and Phos as well. Patient has notes documenting suspected COPD, but I do not see any emphysema on prior lung imaging, and he has never had PFT's that I can find. No wheezing on exam at this time. Will increase pulmicort from 0.25mg  to 0.5mg  BID and increase duonebs form TID to q4hrs but am more suspicious this is fluid overload rather than copd exac. Also, he is in severe Etoh withdraws at this time. Had been on precedex monotherapy (unclear why wasn't receiving benzos also) up until this evening when he was transitioned to phenobarbital monotherapy. After first dose RN reports patient was so somnolent she was concerned he was not protecting his airway. Once he aroused he was still withdrawing; when it was time for the 2nd dose she gave a half dose. At time of my exam shortly after this patient is kicking, swinging left arm trying to hit me, highly agitated, not obeying commands. Will d/c the phenobarbital. Resume precedex. Start IV Ativan PRN for when triggers CIWA. If he begins to require high total doses of IV Ativan, he could develop metabolic acidosis from the propylene glycol. If that turns out to be the case, he may need some scheduled PO Ativan in order to decrease doses of IV Ativan needed. Therefore will place NG tube now so that RN can give  PO meds if/when needed. Strict NPO by mouth. Still high risk for requiring intubation. Will continue to monitor closely.   60 minutes critical care time  Milana ObeyKathleen Lyndia Bury, MD Pulmonary & Critical Care Medicine Pager: [phone]

## 2017-11-28 NOTE — Progress Notes (Signed)
Patient continues to breathe 30-40 times per minute. 260 mg phenobarbital given based on CIWA score of 40. Precedex titrated down from 0.8 to 0.5 and then turned off by the physician at 1836. BP 137/68 and SpO2 94% on 3L. Will continue to monitor patient and update as needed.

## 2017-11-28 NOTE — Progress Notes (Signed)
NEUROHOSPITALISTS STROKE TEAM - DAILY PROGRESS NOTE   ADMISSION HISTORY: Todd [name] is an [age] y.o. male right-handed past medical history of carotid stenosis, CHF, diabetes, hyperlipidemia, hypertension, obesity, peripheral arterial disease on aspirin presents with right sided weakness that began at 1 PM today. The patient notices right arm and leg becoming weak and had a fall around 1 PM. He waited for an hour but his symptoms did not improve and called 911. On arrival EMS noticed a right arm drift, facial droop, slurred speech. His blood pressure was 160/90. [facility], symptoms have gotten worse and patient plegic on both right arm and right leg, left gaze preference and dysarthria. However was noticed the patient was not severely aphasic on arrival. CT head was negative for hemorrhage and patient received TPA. He denies any history of hematuria/ GI bleeding/recent surgery. CT angiogram and CT perfusion were obtained. CT perfusion showed no perfusion deficit, however CT angiogram showed a M2 occlusion. Given patient's right-sided deficits, gaze deviation patient was deemed a candidate for thrombectomy. Absence of perfusion deficit in the left MCA, no significant aphasia- consider doing a stat MRI however this was delayed life-saving treatment and decided to proceed to angio suite.   Date last known well: 1.6.19 Time last known well: 1pm tPA Given: yes NIHSS: 17 Baseline MRS 0  SUBJECTIVE (INTERVAL HISTORY) Sister is at the bedside. Patient is found laying in bed in NAD. Sister voices no new complaints. Patient became agitated last night requiring sedation with Precedex. Alcohol withdrawal is suspected speech  therapy recommended nothing by mouth status    OBJECTIVE Lab Results: CBC:  Recent Labs  Lab 2017/12/07 1441 2017/12/07 1454 11/26/17 0524  WBC 10.1  --  10.4  HGB 15.3 15.3 13.7  HCT 44.8 45.0 41.4  MCV 89.8  --   90.8  PLT 248  --  268   BMP: Recent Labs  Lab 2017/12/07 1441 2017/12/07 1454 11/26/17 0524 11/26/17 1703 11/27/17 0434 11/28/17 0900  NA 135 139 138  --  140 141  K 3.3* 3.4* 2.9* 4.5 3.8 3.7  CL 102 102 103  --  106 108  CO2 25  --  25  --  19* 24  GLUCOSE 190* 192* 188*  --  151* 192*  BUN 13 14 16   --  17 17  CREATININE 0.76 0.70 0.87  --  0.69 0.70  CALCIUM 9.5  --  8.5*  --  9.2 9.2  MG  --   --  1.8  --  1.8 1.7  PHOS  --   --  4.2  --   --   --    Liver Function Tests:  Recent Labs  Lab 2017/12/07 1441  AST 20  ALT 19  ALKPHOS 73  BILITOT 0.7  PROT 7.4  ALBUMIN 3.4*   Thyroid Function Studies:  Recent Labs    11/26/17 0830  TSH 1.212   Coagulation Studies:  Recent Labs    2017/12/07 1441  APTT 26  INR 1.10   PHYSICAL EXAM Temp:  [97.2 F (36.2 C)-99.9 F (37.7 C)] 99.9 F (37.7 C) (01/09 1200) Pulse Rate:  [46-97] 51 (01/09 1300) Resp:  [18-35] 31 (01/09 1300) BP: (122-207)/(57-148) 153/81 (01/09 1300) SpO2:  [89 %-100 %] 96 % (01/09 1300) General - Well nourished, well developed, in no apparent distress,  HEENT-  Normocephalic, Normal external eye/conjunctiva.  Normal external ears. Normal external nose, mucus membranes and septum.   Cardiovascular - Regular  rate and rhythm  Respiratory - Lungs clear bilaterally. No wheezing. Abdomen - soft and non-tender, BS normal Extremities- no edema or cyanosis Neurological Examination Mental Status: sleepy but can be aroused to open eyes. Nonverbal. Follows only very simple commands. Cranial Nerves: II: Visual fields : Chronic right eye blindness , left homonymous hemianopsia  III,IV, VI: ptosis not present, forced deviation to left side. pupils equal, round, reactive to light and accommodation V,VII RIGHT FACIAL DROOP  VIII: hearing normal bilaterally IX,X: uvula rises symmetrically XI: bilateral shoulder shrug XII: midline tongue extension Motor: Right :  Upper extremity   0/5                                       Left:     Upper extremity   5/5             Lower extremity   0/5                                                  Lower extremity   5/5 Tone and bulk:normal tone throughout; no atrophy noted Sensory: Reduced sensation on right side , +neglect  Plantars: Right: downgoing                                Left: downgoing Gait:Not tested  IMAGING: I have personally reviewed the radiological images below and agree with the radiology interpretations. Ct Angio Head and Neck W Or Wo Contrast with Perfusion Result Date: 11/21/2017 IMPRESSION: 1. Proximal left M2 superior division occlusion with partial distal reconstitution. 2. Extensive ICA siphon atherosclerosis with moderate to severe stenoses bilaterally. 3. 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 Occlusion of the nondominant left vertebral artery distal to PICA. Possible severe right V4 stenoses, not well evaluated. 4. Moderate to severe proximal left PCA stenoses. 5. Patent cervical carotid arteries without significant stenosis. Prior right carotid endarterectomy. 6. Aortic Atherosclerosis (ICD10-I70.0) and Emphysema (ICD10-J43.9). 7. Moderate-sized, loculated right pleural effusion with pleural thickening suggesting chronicity. 8. 11 mm part solid pulmonary nodule in the left upper lobe.  Ct Head Wo Contrast Result Date: 11/30/2017 IMPRESSION: 1. No intracranial hemorrhage. 2. Unchanged appearance of the brain. No evidence of new or enlarging infarct.   Mr Brain Wo Contrast Result Date: 11/26/2017 IMPRESSION: 1. Acute/early subacute infarction involving left posterior limb of internal capsule extending into left anterior parahippocampal gyrus and hippocampus. Additional punctate infarct within left inferior cerebellar hemisphere. No hemorrhage or mass effect. 2. Background of mild chronic microvascular ischemic changes and mild parenchymal volume loss of the brain. 3. Small chronic infarct within left middle  cerebellar peduncle. 4. Mild paranasal sinus disease.   Dg Chest Port 1 View Result Date: 12/07/2017 IMPRESSION: Right pleural effusion and lung opacity, consistent with atelectasis or consolidation.  Ct Head Code Stroke Wo Contrast Result Date: 11/21/2017 IMPRESSION: 1. Atrophy and small vessel disease. Cannot exclude early LEFT posterior limb internal capsule infarct. Chronic insult LEFT cerebellum. 2. ASPECTS is 9.   Echocardiogram:  Left ventricle: Inferobasal hypokinesis. Wall thickness was   increased in a pattern of mild LVH. Systolic function was normal.   The estimated ejection fraction was in the range of 50% to 55%.   Doppler parameters are consistent with both elevated ventricular   end-diastolic filling pressure and elevated left atrial filling   pressure.     IMPRESSION: [name] is a 72 y.o. male with PMH of carotid stenosis, CHF, diabetes, hyperlipidemia, hypertension, obesity, peripheral arterial disease on aspirin presents with acute onset of right sided weakness, which progressed to result in hemiplegia.  CT head was negative for hemorrhage and patient received IV tPA. CT perfusion showed no perfusion deficit, however CT angiogram showed a M2 occlusion. Given patient's right-sided deficits, gaze deviation patient was deemed a candidate for thrombectomy. MRI reveals:  Acute/early subacute infarction Left posterior limb of internal capsule extending into left anterior parahippocampal gyrus and hippocampus Infarct within Left inferior cerebellar hemisphere.  Treated with IV TPA STROKE:  Suspected Etiology: Left M2 occlusion and  possibly complication of catheter angiogram  Resultant Symptoms: right-sided deficits, gaze deviation  Stroke Risk Factors: diabetes mellitus, hyperlipidemia and hypertension Other Stroke Risk Factors: Advanced age, Cigarette smoker, ETOH use, Obesity, Body mass index is 30.5 kg/m. , Hx stroke,  OSA/likely undx, CHF, PAD  PROCEDURES: 2017-12-25  Dr Corliss Skains S/P 4 vessel cerebral arteriogram Rt CFA approach. Findings. 1.No occlusions,stenosis or dissections extracranially or intracranially. Occluded Rt common femoral  Below the inguinal ligament  With distal recostitution of the profuda from prominent collaterals and  brisk run off.    11/28/2017 ASSESSMENT:   Patient remains intubated and sedated.  Following only minimal commands.  Nonverbal.  No movement of right side.  CCM following.  Appreciate assistance.  Plan for extubation later today.  We will continue to follow closely.  PLAN  11/28/2017:  Continue sedation as per pulmonary critical care M.D. for suspected alcohol withdrawal Pass panda tube and start tube feeds Frequent neuro checks Telemetry monitoring PT/OT/SLP Ongoing aggressive stroke risk factor management Patient will be counseled to be compliant with his antithrombotic medications Patient will be counseled on Lifestyle modifications including, Diet, Exercise, and Stress Follow up with [facility] in 6 weeks  HX OF STROKES: Small chronic infarct within left middle cerebellar peduncle  INTRACRANIAL Atherosclerosis &Stenosis: May consider DAPT prior to discharge  DYSPHAGIA: NPO until passes SLP swallow evaluation Aspiration Precautions in progress  Medical issues per CCM team Agitation due to presumed alcohol withdrawal Hypotension Hypokalemia Hypocalcemia  Pulmonary nodule 11 mm part solid pulmonary nodule in the left upper lobe Outpatient follow-up CT recommended at 3-6 months to confirm persistence If unchanged, and solid component remains <6 mm, annual CT until 5 years of stability  If persistent these nodules should be considered highly suspicious if the solid component of the nodule is 6 mm or greater in size and enlarging.   HYPERTENSION: Stable SBP goal of < 180. DBP goal of < 105.  Nicardipine drip, Labetolol PRN Long term BP  goal normotensive. May slowly start B/P medications after 48 hours, if applicable Home Meds: NONE  HYPERLIPIDEMIA:    Component Value Date/Time   CHOL 168 11/26/2017 0524   TRIG 96 11/26/2017 0524   HDL 44 11/26/2017 0524   CHOLHDL 3.8 11/26/2017 0524   VLDL 19 11/26/2017 0524   LDLCALC 105 (H) 11/26/2017 0524  Home Meds:  NONE LDL  goal < 70 Will started on  Lipitor to 40 mg daily, once  patient passes SLP evaluation Continue statin at discharge  DIABETES: Lab Results  Component Value Date   HGBA1C 7.6 (H) 11/26/2017  HgbA1c goal < 7.0 Currently on: NovoLog Continue CBG monitoring and SSI to maintain glucose 140-180 mg/dl DM education   TOBACCO ABUSE and Likely ETOH Abuse Current smoker Smoking cessation counseling provided Nicotine patch provided CIWA, as needed  OBESITY Obesity, Body mass index is 30.5 kg/m. Greater than/equal to 30  Other Active Problems: Principal Problem:   Stroke [facility]) Active Problems:   Wheezing   Abnormal CXR   Hypertension, essential   Diabetes mellitus type 2 with complications [facility])    Hospital day # 3 VTE prophylaxis: SCD's  Diet : Diet NPO time specified   FAMILY UPDATES: family at bedside  TEAM UPDATES: Micki Riley, MD STATUS:  FULL   Prior Home Stroke Medications:  aspirin 81 mg daily  Discharge Stroke Meds:  Please discharge patient on TBD   Disposition:  Therapy Recs:              SNF Home Equipment:         PENDING Follow Up:  Follow-up Information    Micki Riley, MD. Schedule an appointment as soon as possible for a visit in 6 week(s).   Specialties:  Neurology, Radiology Contact information: [address] [place] [address] [phone]          Alysia Penna, MD -PCP Follow up in 1-2 weeks       I have personally examined this patient, reviewed notes, independently viewed imaging studies, participated in medical decision making and plan of care.ROS completed by me personally  and pertinent positives fully documented  I have made any additions or clarifications directly to the above note.   Recommend  continue sedation for alcohol withdrawal as per pulmonary critical care team. Insert Panda tube and start tube feeds. No family available at the bedside for discussion today.Karma Greaser and discontinue norepinephrine drip and change blood pressure parameters to systolic blood pressure goal below 180 This patient is critically ill and at significant risk of neurological worsening, death and care requires constant monitoring of vital signs, hemodynamics,respiratory and cardiac monitoring, extensive review of multiple databases, frequent neurological assessment, discussion with family, other specialists and medical decision making of high complexity.I have made any additions or clarifications directly to the above note.This critical care time does not reflect procedure time, or teaching time or supervisory time of PA/NP/Med Resident etc but could involve care discussion time.  I spent 30 minutes of neurocritical care time  in the care of  this patient.     Delia Heady, MD Medical Director [facility] Pager: [phone] 11/28/2017 1:48 PM  To contact Stroke Continuity provider, please refer to [email or web address]. After hours, contact General Neurology

## 2017-11-28 NOTE — Progress Notes (Signed)
eLink Physician-Brief Progress Note Patient Name: Todd BlaseWilliam B [name] DOB: 06/24/1946 MRN: [id number]   Date of Service  11/28/2017  HPI/Events of Note  Camera check Patient in resp distress  eICU Interventions  PCCM team called to bedside  High risk for intubation     Intervention Category Intermediate Interventions: Respiratory distress - evaluation and management  [name] 11/28/2017, 9:28 PM

## 2017-11-29 ENCOUNTER — Inpatient Hospital Stay (HOSPITAL_COMMUNITY): Payer: Medicare Other

## 2017-11-29 DIAGNOSIS — J690 Pneumonitis due to inhalation of food and vomit: Secondary | ICD-10-CM | POA: Diagnosis not present

## 2017-11-29 LAB — BASIC METABOLIC PANEL
ANION GAP: 10 (ref 5–15)
BUN: 18 mg/dL (ref 6–20)
CHLORIDE: 107 mmol/L (ref 101–111)
CO2: 25 mmol/L (ref 22–32)
Calcium: 9 mg/dL (ref 8.9–10.3)
Creatinine, Ser: 0.78 mg/dL (ref 0.61–1.24)
GFR calc non Af Amer: >60 mL/min (ref >60)
Glucose, Bld: 146 mg/dL — ABNORMAL HIGH (ref 65–99)
POTASSIUM: 3.6 mmol/L (ref 3.5–5.1)
SODIUM: 142 mmol/L (ref 135–145)

## 2017-11-29 LAB — CBC WITH DIFFERENTIAL/PLATELET
BASOS ABS: 0 10*3/uL (ref 0.0–0.1)
BASOS PCT: 0 %
EOS ABS: 0 10*3/uL (ref 0.0–0.7)
Eosinophils Relative: 0 %
HEMATOCRIT: 42.4 % (ref 39.0–52.0)
HEMOGLOBIN: 14.3 g/dL (ref 13.0–17.0)
Lymphocytes Relative: 12 %
Lymphs Abs: 1.3 10*3/uL (ref 0.7–4.0)
MCH: 31.6 pg (ref 26.0–34.0)
MCHC: 33.7 g/dL (ref 30.0–36.0)
MCV: 93.8 fL (ref 78.0–100.0)
MONO ABS: 1.1 10*3/uL — AB (ref 0.1–1.0)
MONOS PCT: 11 %
NEUTROS ABS: 8.2 10*3/uL — AB (ref 1.7–7.7)
NEUTROS PCT: 77 %
Platelets: 230 10*3/uL (ref 150–400)
RBC: 4.52 MIL/uL (ref 4.22–5.81)
RDW: 13.8 % (ref 11.5–15.5)
WBC: 10.6 10*3/uL — ABNORMAL HIGH (ref 4.0–10.5)

## 2017-11-29 LAB — PHOSPHORUS: Phosphorus: 2 mg/dL — ABNORMAL LOW (ref 2.5–4.6)

## 2017-11-29 LAB — GLUCOSE, CAPILLARY
GLUCOSE-CAPILLARY: 115 mg/dL — AB (ref 65–99)
GLUCOSE-CAPILLARY: 156 mg/dL — AB (ref 65–99)
GLUCOSE-CAPILLARY: 157 mg/dL — AB (ref 65–99)
Glucose-Capillary: 151 mg/dL — ABNORMAL HIGH (ref 65–99)
Glucose-Capillary: 150 mg/dL — ABNORMAL HIGH (ref 65–99)
Glucose-Capillary: 142 mg/dL — ABNORMAL HIGH (ref 65–99)

## 2017-11-29 LAB — POCT I-STAT 3, ART BLOOD GAS (G3+)
Acid-Base Excess: 2 mmol/L (ref 0.0–2.0)
Bicarbonate: 26.6 mmol/L (ref 20.0–28.0)
O2 Saturation: 100 %
PCO2 ART: 42.4 mmHg (ref 32.0–48.0)
PH ART: 7.405 (ref 7.350–7.450)
TCO2: 28 mmol/L (ref 22–32)
pO2, Arterial: 223 mmHg — ABNORMAL HIGH (ref 83.0–108.0)

## 2017-11-29 LAB — MAGNESIUM
MAGNESIUM: 2.1 mg/dL (ref 1.7–2.4)
MAGNESIUM: 1.8 mg/dL (ref 1.7–2.4)

## 2017-11-29 LAB — URINALYSIS, ROUTINE W REFLEX MICROSCOPIC
Bilirubin Urine: NEGATIVE
Glucose, UA: NEGATIVE mg/dL
Hgb urine dipstick: NEGATIVE
Ketones, ur: 20 mg/dL — AB
LEUKOCYTES UA: NEGATIVE
NITRITE: NEGATIVE
Protein, ur: NEGATIVE mg/dL
Specific Gravity, Urine: 1.01 (ref 1.005–1.030)
pH: 5 (ref 5.0–8.0)

## 2017-11-29 LAB — PROCALCITONIN: PROCALCITONIN: 0.16 ng/mL

## 2017-11-29 MED ORDER — VANCOMYCIN HCL 10 G IV SOLR
2000.0000 mg | Freq: Once | INTRAVENOUS | Status: AC
  Administered 2017-11-29: 2000 mg via INTRAVENOUS
  Filled 2017-11-29: qty 2000

## 2017-11-29 MED ORDER — HYDRALAZINE HCL 20 MG/ML IJ SOLN
10.0000 mg | INTRAMUSCULAR | Status: DC | PRN
  Administered 2017-11-30 – 2017-12-01 (×3): 10 mg via INTRAVENOUS
  Filled 2017-11-29 (×4): qty 1

## 2017-11-29 MED ORDER — FUROSEMIDE 10 MG/ML IJ SOLN
40.0000 mg | Freq: Once | INTRAMUSCULAR | Status: AC
  Administered 2017-11-29: 40 mg via INTRAVENOUS
  Filled 2017-11-29: qty 4

## 2017-11-29 MED ORDER — ADULT MULTIVITAMIN LIQUID CH
15.0000 mL | Freq: Every day | ORAL | Status: DC
  Administered 2017-12-01 – 2017-12-02 (×2): 15 mL
  Filled 2017-11-29 (×2): qty 15

## 2017-11-29 MED ORDER — SODIUM CHLORIDE 0.9 % IV SOLN
3.0000 g | Freq: Four times a day (QID) | INTRAVENOUS | Status: DC
  Administered 2017-11-29 – 2017-12-02 (×13): 3 g via INTRAVENOUS
  Filled 2017-11-29 (×16): qty 3

## 2017-11-29 MED ORDER — PRO-STAT SUGAR FREE PO LIQD
30.0000 mL | Freq: Two times a day (BID) | ORAL | Status: DC
  Administered 2017-11-30 – 2017-12-02 (×4): 30 mL
  Filled 2017-11-29 (×4): qty 30

## 2017-11-29 MED ORDER — VANCOMYCIN HCL IN DEXTROSE 1-5 GM/200ML-% IV SOLN
1000.0000 mg | Freq: Two times a day (BID) | INTRAVENOUS | Status: DC
  Administered 2017-11-29 – 2017-12-02 (×6): 1000 mg via INTRAVENOUS
  Filled 2017-11-29 (×8): qty 200

## 2017-11-29 MED ORDER — VITAMIN B-1 100 MG PO TABS
100.0000 mg | ORAL_TABLET | Freq: Every day | ORAL | Status: DC
  Administered 2017-12-01 – 2017-12-02 (×2): 100 mg
  Filled 2017-11-29 (×2): qty 1

## 2017-11-29 MED ORDER — POTASSIUM CHLORIDE 20 MEQ/15ML (10%) PO SOLN
40.0000 meq | Freq: Once | ORAL | Status: DC
  Filled 2017-11-29: qty 30

## 2017-11-29 MED ORDER — MAGNESIUM SULFATE 2 GM/50ML IV SOLN
2.0000 g | Freq: Once | INTRAVENOUS | Status: AC
  Administered 2017-11-29: 2 g via INTRAVENOUS
  Filled 2017-11-29: qty 50

## 2017-11-29 MED ORDER — PANTOPRAZOLE SODIUM 40 MG PO TBEC: 40.0000 mg | DELAYED_RELEASE_TABLET | Freq: Every day | ORAL | Status: DC

## 2017-11-29 MED ORDER — FOLIC ACID 1 MG PO TABS
1.0000 mg | ORAL_TABLET | Freq: Every day | ORAL | Status: DC
  Administered 2017-12-01 – 2017-12-02 (×2): 1 mg
  Filled 2017-11-29 (×3): qty 1

## 2017-11-29 MED ORDER — OSMOLITE 1.5 CAL PO LIQD
1000.0000 mL | ORAL | Status: DC
  Administered 2017-11-30 – 2017-12-02 (×2): 1000 mL
  Filled 2017-11-29 (×6): qty 1000

## 2017-11-29 MED ORDER — LORAZEPAM 2 MG/ML IJ SOLN
2.0000 mg | INTRAMUSCULAR | Status: DC | PRN
  Administered 2017-11-29 – 2017-11-30 (×2): 2 mg via INTRAVENOUS
  Filled 2017-11-29 (×3): qty 1

## 2017-11-29 NOTE — Progress Notes (Signed)
SLP Cancellation Note  Patient Details Name: Todd [name] MRN: [name] DOB: 11/13/1946   Cancelled treatment:       Reason Eval/Treat Not Completed: Medical issues which prohibited therapy. Per notes, now on NRB, obtunded. Will f/u 1/11.   Ferdinand LangoLeah Liberty Stead MA, CCC-SLP [phone]    [name] [name] 11/29/2017, 10:58 AM

## 2017-11-29 NOTE — Progress Notes (Signed)
NEUROHOSPITALISTS STROKE TEAM - DAILY PROGRESS NOTE   ADMISSION HISTORY: Todd [name] is an 72 y.o. male right-handed past medical history of carotid stenosis, CHF, diabetes, hyperlipidemia, hypertension, obesity, peripheral arterial disease on aspirin presents with right sided weakness that began at 1 PM today. The patient notices right arm and leg becoming weak and had a fall around 1 PM. He waited for an hour but his symptoms did not improve and called 911. On arrival EMS noticed a right arm drift, facial droop, slurred speech. His blood pressure was 160/90. On arrival at [facility] ER, symptoms have gotten worse and patient plegic on both right arm and right leg, left gaze preference and dysarthria. However was noticed the patient was not severely aphasic on arrival. CT head was negative for hemorrhage and patient received TPA. He denies any history of hematuria/ GI bleeding/recent surgery. CT angiogram and CT perfusion were obtained. CT perfusion showed no perfusion deficit, however CT angiogram showed a M2 occlusion. Given patient's right-sided deficits, gaze deviation patient was deemed a candidate for thrombectomy. Absence of perfusion deficit in the left MCA, no significant aphasia- consider doing a stat MRI however this was delayed life-saving treatment and decided to proceed to angio suite.   Date last known well: 2017/12/09 Time last known well: 1pm tPA Given: yes NIHSS: 17 Baseline MRS 0  SUBJECTIVE (INTERVAL HISTORY) No family member is at the bedside. And continues to intermittently agitated and had to be switched from phenobarbital to Ativan and Precedex for sedation and has been back on Cardene drip for blood pressure control. He has also been requiring more oxygen and is on 60% non-rebreather this morning   OBJECTIVE Lab Results: CBC:  Recent Labs  Lab 11/26/17 0524 11/28/17 2206 11/29/17 1211  WBC 10.4 13.0* 10.6*  HGB 13.7  14.4 14.3  HCT 41.4 43.1 42.4  MCV 90.8 93.1 93.8  PLT 268 247 230   BMP: Recent Labs  Lab 11/26/17 0524 11/26/17 1703 11/27/17 0434 11/28/17 0900 11/28/17 1645 11/28/17 2206 11/29/17 0449  NA 138  --  140 141 139  --  142  K 2.9* 4.5 3.8 3.7 3.3*  --  3.6  CL 103  --  106 108 108  --  107  CO2 25  --  19* 24 22  --  25  GLUCOSE 188*  --  151* 192* 159*  --  146*  BUN 16  --  17 17 17   --  18  CREATININE 0.87  --  0.69 0.70 0.68  --  0.78  CALCIUM 8.5*  --  9.2 9.2 9.1  --  9.0  MG 1.8  --  1.8 1.7  --  1.6* 2.1  PHOS 4.2  --   --   --   --  2.2*  --    Liver Function Tests:  Recent Labs  Lab 12/09/2017 1441 11/28/17 1645  AST 20 37  ALT 19 40  ALKPHOS 73 76  BILITOT 0.7 1.1  PROT 7.4 6.6  ALBUMIN 3.4* 3.0*   Thyroid Function Studies:  No results for input(s): TSH, T4TOTAL, T3FREE, THYROIDAB in the last 72 hours. Coagulation Studies:  No results for input(s): APTT, INR in the last 72 hours. PHYSICAL EXAM Temp:  [97.3 F (36.3 C)-98.8 F (37.1 C)] 98.8 F (37.1 C) (01/10 0746) Pulse Rate:  [41-89] 74 (01/10 1345) Resp:  [18-45] 33 (01/10 1345) BP: (127-184)/(59-147) 184/76 (01/10 1345) SpO2:  [91 %-100 %] 96 % (01/10 1345) FiO2 (%):  [  55 %-100 %] 55 % (01/10 1222) General - Well nourished, well developed elderly Caucasian male, in no apparent distress,  HEENT-  Normocephalic, Normal external eye/conjunctiva.  Normal external ears. Normal external nose, mucus membranes and septum.   Cardiovascular - Regular rate and rhythm  Respiratory - Lungs clear bilaterally. No wheezing. Abdomen - soft and non-tender, BS normal Extremities- no edema or cyanosis Neurological Examination Mental Status: sleepy but can be aroused to open eyes. Nonverbal. Follows only very simple commands. Cranial Nerves: II: Visual fields : Chronic right eye blindness , left homonymous hemianopsia  III,IV, VI: ptosis not present, forced deviation to left side. pupils equal, round, reactive to  light and accommodation V,VII RIGHT FACIAL DROOP  VIII: hearing normal bilaterally IX,X: uvula rises symmetrically XI: bilateral shoulder shrug XII: midline tongue extension Motor: Right :  Upper extremity   0/5                                      Left:     Upper extremity   5/5             Lower extremity   0/5                                                  Lower extremity   5/5 Tone and bulk:normal tone throughout; no atrophy noted Sensory: Reduced sensation on right side , +neglect  Plantars: Right: downgoing                                Left: downgoing Gait:Not tested  IMAGING: I have personally reviewed the radiological images below and agree with the radiology interpretations. Ct Angio Head and Neck W Or Wo Contrast with Perfusion Result Date: 11/28/2017 IMPRESSION: 1. Proximal left M2 superior division occlusion with partial distal reconstitution. 2. Extensive ICA siphon atherosclerosis with moderate to severe stenoses bilaterally. 3. 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 Occlusion of the nondominant left vertebral artery distal to PICA. Possible severe right V4 stenoses, not well evaluated. 4. Moderate to severe proximal left PCA stenoses. 5. Patent cervical carotid arteries without significant stenosis. Prior right carotid endarterectomy. 6. Aortic Atherosclerosis (ICD10-I70.0) and Emphysema (ICD10-J43.9). 7. Moderate-sized, loculated right pleural effusion with pleural thickening suggesting chronicity. 8. 11 mm part solid pulmonary nodule in the left upper lobe.  Ct Head Wo Contrast Result Date: 12/07/2017 IMPRESSION: 1. No intracranial hemorrhage. 2. Unchanged appearance of the brain. No evidence of new or enlarging infarct.   Mr Brain Wo Contrast Result Date: 11/26/2017 IMPRESSION: 1. Acute/early subacute infarction involving left posterior limb of internal capsule extending into left anterior parahippocampal gyrus and hippocampus. Additional  punctate infarct within left inferior cerebellar hemisphere. No hemorrhage or mass effect. 2. Background of mild chronic microvascular ischemic changes and mild parenchymal volume loss of the brain. 3. 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 4. Mild paranasal sinus disease.   Dg Chest Port 1 View Result Date: 12/15/2017 IMPRESSION: Right pleural effusion and lung opacity, consistent with atelectasis or consolidation.  Ct Head Code Stroke Wo Contrast Result Date: 12/18/2017 IMPRESSION: 1. Atrophy and small vessel disease. Cannot exclude early LEFT posterior limb internal capsule infarct.  Chronic insult LEFT cerebellum. 2. ASPECTS is 9.   Echocardiogram:                                              Left ventricle: Inferobasal hypokinesis. Wall thickness was   increased in a pattern of mild LVH. Systolic function was normal.   The estimated ejection fraction was in the range of 50% to 55%.   Doppler parameters are consistent with both elevated ventricular   end-diastolic filling pressure and elevated left atrial filling   pressure.     IMPRESSION: Todd [name] is a 72 y.o. male with PMH of carotid stenosis, CHF, diabetes, hyperlipidemia, hypertension, obesity, peripheral arterial disease on aspirin presents with acute onset of right sided weakness, which progressed to result in hemiplegia.  CT head was negative for hemorrhage and patient received IV tPA. CT perfusion showed no perfusion deficit, however CT angiogram showed a M2 occlusion. Given patient's right-sided deficits, gaze deviation patient was deemed a candidate for thrombectomy. MRI reveals: Acute/early subacute infarction Left posterior limb of internal capsule extending into left anterior parahippocampal gyrus and hippocampus Infarct within Left inferior cerebellar hemisphere.  Treated with IV TPA  now persistent agitation and confusion likely due to superimposed alcohol withdrawal STROKE:  Suspected  Etiology: Left M2 occlusion and  possibly complication of catheter angiogram  Resultant Symptoms: right-sided deficits, gaze deviation  Stroke Risk Factors: diabetes mellitus, hyperlipidemia and hypertension Other Stroke Risk Factors: Advanced age, Cigarette smoker, ETOH use, Obesity, Body mass index is 30.5 kg/m. , Hx stroke, OSA/likely undx, CHF, PAD  PROCEDURES: 12/04/2017  Dr Corliss Skains S/P 4 vessel cerebral arteriogram Rt CFA approach. Findings. 1.No occlusions,stenosis or dissections extracranially or intracranially. Occluded Rt common femoral  Below the inguinal ligament  With distal recostitution of the profuda from prominent collaterals and  brisk run off.    11/29/2017 ASSESSMENT:   Patient remains  agitated and sedated.  Following only minimal commands.  Nonverbal.  No movement of right side.  CCM following.  Appreciate assistance.     We will continue to follow closely.  PLAN  11/29/2017:  Continue sedation as per pulmonary critical care M.D. for suspected alcohol withdrawal Pass panda tube and start tube feeds Frequent neuro checks Telemetry monitoring PT/OT/SLP Ongoing aggressive stroke risk factor management Patient will be counseled to be compliant with his antithrombotic medications Patient will be counseled on Lifestyle modifications including, Diet, Exercise, and Stress Follow up with [facility] in 6 weeks  HX OF STROKES: Small chronic infarct within left middle cerebellar peduncle  INTRACRANIAL Atherosclerosis &Stenosis: May consider DAPT prior to discharge  DYSPHAGIA: NPO until passes SLP swallow evaluation Aspiration Precautions in progress  Medical issues per CCM team Agitation due to presumed alcohol withdrawal Hypotension Hypokalemia Hypocalcemia  Pulmonary nodule 11 mm part solid pulmonary nodule in the left upper lobe Outpatient follow-up CT recommended at 3-6 months to confirm persistence If unchanged, and solid component remains  <6 mm, annual CT until 5 years of stability  If persistent these nodules should be considered highly suspicious if the solid component of the nodule is 6 mm or greater in size and enlarging.   HYPERTENSION: Stable SBP goal of < 180. DBP goal of < 105.  Nicardipine drip, Labetolol PRN Long term BP goal normotensive. May slowly start B/P medications after 48 hours, if applicable Home Meds: NONE  HYPERLIPIDEMIA:    Component Value Date/Time   CHOL 168 11/26/2017 0524   TRIG 96 11/26/2017 0524   HDL 44 11/26/2017 0524   CHOLHDL 3.8 11/26/2017 0524   VLDL 19 11/26/2017 0524   LDLCALC 105 (H) 11/26/2017 0524  Home Meds:  NONE LDL  goal < 70 Will started on  Lipitor to 40 mg daily, once patient passes SLP evaluation Continue statin at discharge  DIABETES: Lab Results  Component Value Date   HGBA1C 7.6 (H) 11/26/2017  HgbA1c goal < 7.0 Currently on: NovoLog Continue CBG monitoring and SSI to maintain glucose 140-180 mg/dl DM education   TOBACCO ABUSE and Likely ETOH Abuse Current smoker Smoking cessation counseling provided Nicotine patch provided CIWA, as needed  OBESITY Obesity, Body mass index is 30.5 kg/m. Greater than/equal to 30  Other Active Problems: Principal Problem:   Stroke [facility]) Active Problems:   Wheezing   Abnormal CXR   Hypertension, essential   Diabetes mellitus type 2 with complications (HCC)   Alcohol withdrawal delirium (HCC)   Aspiration pneumonia [facility])    Hospital day # 4 VTE prophylaxis: SCD's  Diet : Diet NPO time specified   FAMILY UPDATES: family at bedside  TEAM UPDATES: Micki Riley, MD STATUS:  FULL   Prior Home Stroke Medications:  aspirin 81 mg daily  Discharge Stroke Meds:  Please discharge patient on TBD   Disposition:  Therapy Recs:              SNF Home Equipment:         PENDING Follow Up:  Follow-up Information    Micki Riley, MD. Schedule an appointment as soon as possible for a visit in 6 week(s).     Specialties:  Neurology, Radiology Contact information: [address] [place] [address] [phone]          Alysia Penna, MD -PCP Follow up in 1-2 weeks       I have personally examined this patient, reviewed notes, independently viewed imaging studies, participated in medical decision making and plan of care.ROS completed by me personally and pertinent positives fully documented  I have made any additions or clarifications directly to the above note.   Recommend  continue sedation for alcohol withdrawal as per pulmonary critical care team. Insert Panda tube and start tube feeds. No family available at the bedside for discussion today.Karma Greaser and discontinue norepinephrine drip and change blood pressure parameters to systolic blood pressure goal below 180 This patient is critically ill and at significant risk of neurological worsening, death and care requires constant monitoring of vital signs, hemodynamics,respiratory and cardiac monitoring, extensive review of multiple databases, frequent neurological assessment, discussion with family, other specialists and medical decision making of high complexity.I have made any additions or clarifications directly to the above note.This critical care time does not reflect procedure time, or teaching time or supervisory time of PA/NP/Med Resident etc but could involve care discussion time.  I spent 30 minutes of neurocritical care time  in the care of  this patient.     Delia Heady, MD Medical Director [facility] Pager: [phone] 11/29/2017 2:34 PM  To contact Stroke Continuity provider, please refer to [email or web address]. After hours, contact General Neurology

## 2017-11-29 NOTE — Progress Notes (Signed)
Pharmacy Antibiotic Note  Todd [name] is a 72 y.o. male admitted on 11/22/2017 as a code stroke.  Pharmacy has been consulted for unasyn and vancomycin dosing. Pt is afebrile and WBC is mildly elevated at 13. SCr is WNL.   Plan: Vancomycin 2gm IV x 1 then 1gm IV Q12H Unasyn 3gm IV Q6H F/u renal fxn, C&S, clinical status and trough at SS  Height: 6' (182.9 cm) Weight: 224 lb 13.9 oz (102 kg) IBW/kg (Calculated) : 77.6  Temp (24hrs), Avg:98.7 F (37.1 C), Min:97.3 F (36.3 C), Max:99.9 F (37.7 C)  Recent Labs  Lab 02-24-18 1441  11/26/17 0524 11/27/17 0434 11/28/17 0900 11/28/17 1645 11/28/17 2206 11/29/17 0449  WBC 10.1  --  10.4  --   --   --  13.0*  --   CREATININE 0.76   < > 0.87 0.69 0.70 0.68  --  0.78  LATICACIDVEN  --   --   --   --   --   --  1.4  --    < > = values in this interval not displayed.    Estimated Creatinine Clearance: 104.7 mL/min (by C-G formula based on SCr of 0.78 mg/dL).    No Known Allergies  Antimicrobials this admission: Unasn 1/10>> Vanc 1/10>>  Dose adjustments this admission: N/A  Microbiology results: Pending  Thank you for allowing pharmacy to be a part of this patient's care.  Todd [name], Todd LeachRachel [name] 11/29/2017 10:38 AM

## 2017-11-29 NOTE — Progress Notes (Signed)
PT Cancellation Note  Patient Details Name: Todd [name] MRN: [id number] DOB: 07/09/1946   Cancelled Treatment:    Reason Eval/Treat Not Completed: Medical issues which prohibited therapy(per RN not medically appropriate with [name])   Ananda Sitzer B Michaell Grider 11/29/2017, 8:02 AM  Delaney MeigsMaija Tabor Indya Oliveria, PT [phone]

## 2017-11-29 NOTE — Progress Notes (Signed)
Initial Nutrition Assessment  DOCUMENTATION CODES:   Obesity unspecified  INTERVENTION:   Initiate TF via NG tube Osmolite 1.5 @ 55 ml/hr (1320 ml/hr) 30 ml Prostat BID Provides: 2180 kcal, 112 grams protein, and 1005 ml free water.    NUTRITION DIAGNOSIS:   Inadequate oral intake related to inability to eat as evidenced by NPO status.  GOAL:   Patient will meet greater than or equal to 90% of their needs  MONITOR:   TF tolerance, Diet advancement(goals of care)  REASON FOR ASSESSMENT:   Consult Enteral/tube feeding initiation and management  ASSESSMENT:   Pt with PMH of DM, CHF, CAD, COPD with long-term tobacco abuse, Depression, HTN, HLD, obeisty, PAD, and ETOH abuse admitted with CVA (L M2 occlusion) s/p tPA.    Pt discussed during ICU rounds and with RN.  Per RN sister now in room and will begin discussion with palliative to determine GOC for patient.   1/9 pt was scheduled for Cortak placement but was unsuccessful d/t ETOH withdrawal. Overnight pt with increased [name] concern for CHF exacerbation, given IV lasix. NG placed for medications.  NG to low intermittent suction  UOP: 1500 x 24hrs Pt is 4 L positive  Medications reviewed and include: folic acid, lasix x 1, novolog SSI every 4 hrs, MVI, KCl x 1, thiamine  Labs reviewed CBG's: 115-157  Lab Results  Component Value Date   HGBA1C 7.6 (H) 11/26/2017      NUTRITION - FOCUSED PHYSICAL EXAM:    Most Recent Value  Orbital Region  No depletion  Upper Arm Region  No depletion  Thoracic and Lumbar Region  No depletion  Buccal Region  No depletion  Temple Region  No depletion  Clavicle Bone Region  No depletion  Clavicle and Acromion Bone Region  No depletion  Scapular Bone Region  Unable to assess  Dorsal Hand  No depletion  Patellar Region  No depletion  Anterior Thigh Region  No depletion  Posterior Calf Region  No depletion  Edema (RD Assessment)  Mild  Hair  Reviewed  Eyes  Unable to assess   Mouth  Unable to assess  Skin  Reviewed  Nails  Reviewed       Diet Order:  Diet NPO time specified  EDUCATION NEEDS:   No education needs have been identified at this time  Skin:  Skin Assessment: Reviewed RN Assessment  Last BM:  unknown  Height:   Ht Readings from Last 1 Encounters:  21-Apr-2018 6' (1.829 m)    Weight:   Wt Readings from Last 1 Encounters:  21-Apr-2018 224 lb 13.9 oz (102 kg)    Ideal Body Weight:  80.9 kg  BMI:  Body mass index is 30.5 kg/m.  Estimated Nutritional Needs:   Kcal:  2050-2300  Protein:  102-120 grams  Fluid:  2 L/day  Kendell BaneHeather Leva Baine RD, LDN, CNSC [phone] Pager [phone] After Hours Pager

## 2017-11-29 NOTE — Progress Notes (Signed)
PULMONARY / CRITICAL CARE MEDICINE   Name: Todd [name] MRN: [id number] DOB: [birth year]    ADMISSION DATE:  12/01/2017   CHIEF COMPLAINT:  Code stroke  HISTORY OF PRESENT ILLNESS:        This is a 72 year old smoker was originally seen in consultation at the request of Dr. Farrel Demark for recommendation on further evaluation and management of CODE STROKE.  He presented with L gaze deviation and R facial droop and received tPA in the ED. CTA suggested a left M2 occlusion, and he was taken to angiography where no occlusion was found following the tPA. He arrived in the ICU hypertensive. He was given labetalol and became bradycardic into the 40's. Sedation was held to see if bradycardia might be secondary to an intracranial catastrophe. He was following instructions but not moving the right side. CT of the head did not show hemorrhage. He has intubated for airway protection. On 1/8, started showing symptoms suspicious for alcohol withdrawal (with family providing history of regular EtOH consumption at baseline).  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  PAST SURGICAL HISTORY: He  has a past surgical history that includes Carotid endarterectomy; Radiology with anesthesia (N/A, 12/20/2017); IR ANGIO INTRA EXTRACRAN SEL COM CAROTID INNOMINATE BILAT MOD SED (12/02/2017); IR ANGIO VERTEBRAL SEL VERTEBRAL UNI L MOD SED (12/14/2017); and IR ANGIO VERTEBRAL SEL SUBCLAVIAN INNOMINATE UNI R MOD SED (12/06/2017).  No Known Allergies  No current facility-administered medications on file prior to encounter.    Current Outpatient Medications on File Prior to Encounter  Medication Sig  . amLODipine (NORVASC) 5 MG tablet Take 5 mg by mouth  daily.  Marland Kitchen atorvastatin (LIPITOR) 20 MG tablet Take 20 mg by mouth daily.  . citalopram (CELEXA) 40 MG tablet Take 40 mg by mouth daily.  Marland Kitchen dicyclomine (BENTYL) 20 MG tablet Take 20 mg by mouth 2 (two) times daily.  . insulin lispro protamine-lispro (HUMALOG 75/25 MIX) (75-25) 100 UNIT/ML SUSP injection Inject 57 Units into the skin 2 (two) times daily.  . methocarbamol (ROBAXIN) 500 MG tablet Take 500 mg by mouth every 8 (eight) hours as needed for muscle spasms.  Marland Kitchen oxybutynin (DITROPAN) 5 MG tablet Take 5 mg by mouth 2 (two) times daily.  Marland Kitchen oxyCODONE-acetaminophen (PERCOCET/ROXICET) 5-325 MG tablet Take 1 tablet by mouth every 8 (eight) hours as needed for severe pain.  . tamsulosin (FLOMAX) 0.4 MG CAPS capsule Take 0.8 mg by mouth daily after supper.   . traMADol (ULTRAM) 50 MG tablet Take 50-100 mg by mouth every 6 (six) hours as needed for moderate pain.  Marland Kitchen aspirin 81 MG tablet Take 81 mg by mouth daily.    Current Facility-Administered Medications:  .   stroke: mapping our early stages of recovery book, , Does not apply, Once, Aroor, Georgiana Spinner R, MD .  acetaminophen (TYLENOL) tablet 650 mg, 650 mg, Oral, Q4H PRN **OR** acetaminophen (TYLENOL) solution 650 mg, 650 mg, Per Tube, Q4H PRN **OR** acetaminophen (TYLENOL) suppository 650 mg, 650 mg, Rectal, Q4H PRN, Aroor, Georgiana Spinner R, MD .  albuterol (PROVENTIL) (2.5 MG/3ML) 0.083% nebulizer solution 2.5 mg, 2.5 mg, Nebulization, Q2H PRN, Marcelle Smiling, MD, 2.5 mg at 11/28/17 0116 .  aspirin suppository 300 mg, 300 mg, Rectal, Daily, Rejeana Brock,  MD, 300 mg at 11/28/17 1000 .  budesonide (PULMICORT) nebulizer solution 0.5 mg, 0.5 mg, Nebulization, BID, Hammonds, Curt Jews, MD .  chlorhexidine (PERIDEX) 0.12 % solution 15 mL, 15 mL, Mouth Rinse, BID, Micki Riley, MD, 15 mL at 11/28/17 2055 .  dexmedetomidine (PRECEDEX) 200 MCG/50ML (4 mcg/mL) infusion, 0.2-1.4 mcg/kg/hr, Intravenous, Continuous, Hammonds, Curt Jews, MD, Last Rate:  15.3 mL/hr at 11/29/17 0642, 0.6 mcg/kg/hr at 11/29/17 0642 .  enalaprilat (VASOTEC) injection 1.25 mg, 1.25 mg, Intravenous, Q6H, Marcelle Smiling, MD, 1.25 mg at 11/29/17 0517 .  folic acid injection 1 mg, 1 mg, Intravenous, Daily, Micki Riley, MD, 1 mg at 11/28/17 1022 .  insulin aspart (novoLOG) injection 0-15 Units, 0-15 Units, Subcutaneous, Q4H, Tobey Grim, NP, 3 Units at 11/29/17 0817 .  ipratropium-albuterol (DUONEB) 0.5-2.5 (3) MG/3ML nebulizer solution 3 mL, 3 mL, Nebulization, Q4H, Hammonds, Curt Jews, MD, 3 mL at 11/29/17 0321 .  labetalol (NORMODYNE,TRANDATE) injection 20 mg, 20 mg, Intravenous, Q2H PRN, Micki Riley, MD, 20 mg at 11/27/17 2328 .  LORazepam (ATIVAN) injection 1-4 mg, 1-4 mg, Intravenous, Q1H PRN, Hammonds, Curt Jews, MD, 2 mg at 11/28/17 2349 .  MEDLINE mouth rinse, 15 mL, Mouth Rinse, q12n4p, Micki Riley, MD, 15 mL at 11/28/17 1700 .  nicardipine (CARDENE) 20mg  in 0.86% saline IV infusion (0.1 mg/ml), 3-15 mg/hr, Intravenous, Continuous, Lynnell Jude, MD, Stopped at 11/29/17 0345 .  pantoprazole (PROTONIX) injection 40 mg, 40 mg, Intravenous, QHS, Aroor, Dara Lords, MD, 40 mg at 11/28/17 2055 .  thiamine (B-1) injection 100 mg, 100 mg, Intravenous, Daily, Pearlean Brownie, Pramod S, MD, 100 mg at 11/28/17 1000   REVIEW OF SYSTEMS:   Unobtainable  SUBJECTIVE: did not tolerate attempted placement of feeding tube. Switched from phenobarbital back to Precedex gtt and Ativan PRN overnight. Went back on Cardene overnight for blood pressure control. Off Cardene gtt but had to go back on it last night. Remains extubated but now needing 60% NRB. Obtunded. Opens eyes to noxious stimuli. Continues to have R hemiparesis. R toes and feet flicker. Dense deficit in R UE. Still has L gaze preference.   VITAL SIGNS: BP (!) 161/79   Pulse (!) 56   Temp 98.8 F (37.1 C)   Resp (!) 30   Ht 6' (1.829 m)   Wt 102 kg (224 lb 13.9 oz)   SpO2 100%   BMI 30.50  kg/m     VENTILATOR SETTINGS: liberated from vent  INTAKE / OUTPUT: I/O last 3 completed shifts: In: 2590.3 [I.V.:2590.3] Out: 2175 [Urine:2175]  PHYSICAL EXAMINATION: General: Somnolent. Unable to assess orientation due to dysphasia/aphasia. Neuro: L gaze preference. Pupils equal, 2 mm. R Babinksi. DTR 1+ @ RUE, 2+ @ LUE, 1+ @ RLE, 1+ @ LLE. Neck: Right carotid endarterectomy scar.  There is no JVD CHEST: Symmetric in development and expansion. Diminished air entry. B rales and rhonchi and wheezes. Regular S1 and S2 without murmur, rub or gallop. ABDOMEN: obese, soft and nontender. no organomegaly, no icterus. EXTREMITIES: no dependent edema. No clubbing. No cyanosis.  LABS:  BMET Recent Labs  Lab 11/28/17 0900 11/28/17 1645 11/29/17 0449  NA 141 139 142  K 3.7 3.3* 3.6  CL 108 108 107  CO2 24 22 25   BUN 17 17 18   CREATININE 0.70 0.68 0.78  GLUCOSE 192* 159* 146*    Electrolytes Recent Labs  Lab 11/26/17 0524  11/28/17 0900 11/28/17 1645 11/28/17 2206 11/29/17 0449  CALCIUM 8.5*   < >  9.2 9.1  --  9.0  MG 1.8   < > 1.7  --  1.6* 2.1  PHOS 4.2  --   --   --  2.2*  --    < > = values in this interval not displayed.    CBC Recent Labs  Lab August 28, 2018 1441 August 28, 2018 1454 11/26/17 0524 11/28/17 2206  WBC 10.1  --  10.4 13.0*  HGB 15.3 15.3 13.7 14.4  HCT 44.8 45.0 41.4 43.1  PLT 248  --  268 247    Coag's Recent Labs  Lab August 28, 2018 1441  APTT 26  INR 1.10    Sepsis Markers Recent Labs  Lab 11/28/17 2206  LATICACIDVEN 1.4    ABG Recent Labs  Lab 11/26/17 0841 11/28/17 1404 11/28/17 2200  PHART 7.339* 7.431 7.388  PCO2ART 47.9 32.8 40.3  PO2ART 80.0* 78.0* 239.0*    Liver Enzymes Recent Labs  Lab August 28, 2018 1441 11/28/17 1645  AST 20 37  ALT 19 40  ALKPHOS 73 76  BILITOT 0.7 1.1  ALBUMIN 3.4* 3.0*    Cardiac Enzymes No results for input(s): TROPONINI, PROBNP in the last 168 hours.  Glucose Recent Labs  Lab 11/28/17 1115  11/28/17 1545 11/28/17 1939 11/28/17 2307 11/29/17 0317 11/29/17 0745  GLUCAP 159* 160* 148* 126* 115* 157*    Imaging  Dg Chest Port 1 View  Result Date: 11/28/2017 CLINICAL DATA:  72 year old male with acute respiratory failure and hypoxia. EXAM: PORTABLE CHEST 1 VIEW COMPARISON:  Chest radiograph dated 11/28/2017 FINDINGS: There is a right-sided pleural effusion. There is associated atelectatic changes versus infiltrate of the right lung. The left lung appears clear. A focal area of opacity over the right cardiac silhouette to the right of the spine appears similar or slightly more prominent compared to the earlier radiograph and may represent loculated effusion or an area of consolidative changes. PA and lateral views of the chest may provide better evaluation. There is no pneumothorax. Stable mild cardiomegaly. Atherosclerotic calcification of the aortic arch. No acute osseous pathology. IMPRESSION: Overall no significant interval change in the size of the right-sided pleural effusion and aeration of the right lung compared to the earlier radiograph. Focal opacity over the right cardiac silhouette may represent loculated effusion or infiltrate. PA and lateral views may provide better evaluation. Electronically Signed   By: Elgie CollardArash  Radparvar M.D.   On: 11/28/2017 22:31   Dg Abd Portable 1v  Result Date: 11/29/2017 CLINICAL DATA:  NG placement EXAM: PORTABLE ABDOMEN - 1 VIEW COMPARISON:  None. FINDINGS: NGT tip near the GE junction/proximal stomach. Side hole distal esophagus. Recommend advancing 10 cm. Nonobstructive bowel gas pattern. Right pleural effusion and right lower lobe airspace disease unchanged. IMPRESSION: NG tip near the GE junction.  Recommend advancing 10 cm. Electronically Signed   By: Marlan Palauharles  Clark M.D.   On: 11/29/2017 08:05    ECHO (11/26/2017) - Left ventricle: Inferobasal hypokinesis. Wall thickness was increased in a pattern of mild LVH. Systolic function was normal.  The  estimated ejection fraction was in the range of 50% to 55%.  Doppler parameters are consistent with both elevated ventricular end-diastolic filling pressure and elevated left atrial filling pressure. - Left atrium: The atrium was moderately dilated. - Atrial septum: Marked hypertrophy of the basal atrial septum. Likely lipomatous but consider TEE given setting of CVA. No defect or patent foramen ovale was identified.   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 Got tPA. Angiographically, no evidence of  thrombus after tPA. No additional intervention was performed. Liberated from ventilator 11/26/2017.  ASSESSMENT / PLAN:  PULMONARY A: COPD with long-term tobacco abuse history ASPIRATION PNEUMONIA ABNORMAL CXR with R basilar consolidation Check ABG. With abnormal CXR and leukocytosis and altered mental status, will start antibiotic therapy for aspiration pneumonia (Unasyn/vancomycin). Continue DuoNeb/Pulmicort Keep HOB elevated 30+ degrees Airway care Place feeding tube to start nutrition. Dietary consult for tube feed recommendations. 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  > 48 hours out from receiving tPA. Alcohol withdrawal/Delirium tremens Further orders per Neurology. Wean Precedex as tolerated. Ativan PRN. Titrate to RASS 0 ot -1. Case discussed with Dr. Pearlean Brownie.   METABOLIC A: type 2 diabetes with neurologic complications Hypokalemia On sliding scale insulin. Replete potassium Check BMP, Mg.   Marcelle Smiling, MD Pulmonary and Critical Care Medicine [facility] Pager: [phone]  11/29/2017, 9:40 AM

## 2017-11-30 ENCOUNTER — Encounter (HOSPITAL_COMMUNITY): Payer: Self-pay

## 2017-11-30 ENCOUNTER — Inpatient Hospital Stay (HOSPITAL_COMMUNITY): Payer: Medicare Other

## 2017-11-30 LAB — BASIC METABOLIC PANEL
ANION GAP: 10 (ref 5–15)
BUN: 24 mg/dL — ABNORMAL HIGH (ref 6–20)
CALCIUM: 9 mg/dL (ref 8.9–10.3)
CHLORIDE: 107 mmol/L (ref 101–111)
CO2: 25 mmol/L (ref 22–32)
Creatinine, Ser: 0.68 mg/dL (ref 0.61–1.24)
GFR calc non Af Amer: >60 mL/min (ref >60)
Glucose, Bld: 181 mg/dL — ABNORMAL HIGH (ref 65–99)
Potassium: 3.2 mmol/L — ABNORMAL LOW (ref 3.5–5.1)
SODIUM: 142 mmol/L (ref 135–145)

## 2017-11-30 LAB — BLOOD CULTURE ID PANEL (REFLEXED)
Acinetobacter baumannii: NOT DETECTED
CANDIDA GLABRATA: NOT DETECTED
CANDIDA TROPICALIS: NOT DETECTED
Candida albicans: NOT DETECTED
Candida krusei: NOT DETECTED
Candida parapsilosis: NOT DETECTED
Carbapenem resistance: NOT DETECTED
ENTEROCOCCUS SPECIES: NOT DETECTED
Enterobacter cloacae complex: NOT DETECTED
Enterobacteriaceae species: DETECTED — AB
Escherichia coli: NOT DETECTED
HAEMOPHILUS INFLUENZAE: NOT DETECTED
Klebsiella oxytoca: NOT DETECTED
Klebsiella pneumoniae: NOT DETECTED
LISTERIA MONOCYTOGENES: NOT DETECTED
Methicillin resistance: DETECTED — AB
Neisseria meningitidis: NOT DETECTED
PROTEUS SPECIES: NOT DETECTED
Pseudomonas aeruginosa: NOT DETECTED
SERRATIA MARCESCENS: NOT DETECTED
STAPHYLOCOCCUS AUREUS BCID: NOT DETECTED
STAPHYLOCOCCUS SPECIES: DETECTED — AB
STREPTOCOCCUS AGALACTIAE: NOT DETECTED
STREPTOCOCCUS PNEUMONIAE: NOT DETECTED
STREPTOCOCCUS PYOGENES: NOT DETECTED
Streptococcus species: NOT DETECTED

## 2017-11-30 LAB — GLUCOSE, CAPILLARY
GLUCOSE-CAPILLARY: 144 mg/dL — AB (ref 65–99)
GLUCOSE-CAPILLARY: 157 mg/dL — AB (ref 65–99)
GLUCOSE-CAPILLARY: 189 mg/dL — AB (ref 65–99)
GLUCOSE-CAPILLARY: 274 mg/dL — AB (ref 65–99)
Glucose-Capillary: 190 mg/dL — ABNORMAL HIGH (ref 65–99)
Glucose-Capillary: 150 mg/dL — ABNORMAL HIGH (ref 65–99)

## 2017-11-30 LAB — MAGNESIUM
MAGNESIUM: 2 mg/dL (ref 1.7–2.4)
Magnesium: 1.9 mg/dL (ref 1.7–2.4)

## 2017-11-30 LAB — PHOSPHORUS
PHOSPHORUS: 3 mg/dL (ref 2.5–4.6)
Phosphorus: 2.5 mg/dL (ref 2.5–4.6)

## 2017-11-30 LAB — PROCALCITONIN: PROCALCITONIN: 0.11 ng/mL

## 2017-11-30 MED ORDER — PANTOPRAZOLE SODIUM 40 MG PO PACK
40.0000 mg | PACK | Freq: Every day | ORAL | Status: DC
  Administered 2017-11-30 – 2017-12-01 (×2): 40 mg
  Filled 2017-11-30: qty 20

## 2017-11-30 MED ORDER — LORAZEPAM 2 MG/ML IJ SOLN
1.0000 mg | Freq: Four times a day (QID) | INTRAMUSCULAR | Status: DC
  Administered 2017-11-30: 1 mg via INTRAVENOUS
  Administered 2017-11-30 – 2017-12-02 (×8): 2 mg via INTRAVENOUS
  Filled 2017-11-30 (×9): qty 1

## 2017-11-30 MED ORDER — POTASSIUM CHLORIDE 10 MEQ/100ML IV SOLN
10.0000 meq | INTRAVENOUS | Status: AC
  Administered 2017-11-30 (×4): 10 meq via INTRAVENOUS
  Filled 2017-11-30 (×4): qty 100

## 2017-11-30 MED ORDER — DEXMEDETOMIDINE HCL IN NACL 200 MCG/50ML IV SOLN: 0.2000 ug/kg/h | INTRAVENOUS | Status: DC

## 2017-11-30 NOTE — Progress Notes (Addendum)
PULMONARY / CRITICAL CARE MEDICINE   Name: Todd BlaseWilliam B [name] MRN: [id number] DOB: 11/06/1946    ADMISSION DATE:  11/22/2017  CHIEF COMPLAINT:  Code stroke    SUBJECTIVE: arousable. Answers questions appropriately. Dysarthric. Off Cardene gtt. Remains extubated. Now on nasal cannula. Opens eyes to noxious stimuli. R facial droop/ Continues to have R hemiparesis. R toes and feet flicker. Dense deficit in R UE. No gaze preference.   HISTORY OF PRESENT ILLNESS:        This is a 72 year old smoker was originally seen in consultation at the request of Dr. Farrel Demarkarla Price for recommendation on further evaluation and management of CODE STROKE.  He presented with L gaze deviation and R facial droop and received tPA in the ED. CTA suggested a left M2 occlusion, and he was taken to angiography where no occlusion was found following the tPA. He arrived in the ICU hypertensive. He was given labetalol and became bradycardic into the 40's. Sedation was held to see if bradycardia might be secondary to an intracranial catastrophe. He was following instructions but not moving the right side. CT of the head did not show hemorrhage. He has intubated for airway protection. On 1/8, started showing symptoms suspicious for alcohol withdrawal (with family providing history of regular EtOH consumption at baseline).  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  PAST SURGICAL HISTORY: He  has a past surgical history that includes Carotid endarterectomy; Radiology with anesthesia (N/A, 11/21/2017); IR ANGIO INTRA EXTRACRAN SEL COM CAROTID INNOMINATE BILAT MOD SED (12/02/2017); IR ANGIO VERTEBRAL SEL VERTEBRAL UNI L MOD SED (12/17/2017); and IR ANGIO VERTEBRAL SEL  SUBCLAVIAN INNOMINATE UNI R MOD SED (12/12/2017).  REVIEW OF SYSTEMS:   Unobtainable  No Known Allergies  No current facility-administered medications on file prior to encounter.    Current Outpatient Medications on File Prior to Encounter  Medication Sig  . amLODipine (NORVASC) 5 MG tablet Take 5 mg by mouth daily.  Marland Kitchen. atorvastatin (LIPITOR) 20 MG tablet Take 20 mg by mouth daily.  . citalopram (CELEXA) 40 MG tablet Take 40 mg by mouth daily.  Marland Kitchen. dicyclomine (BENTYL) 20 MG tablet Take 20 mg by mouth 2 (two) times daily.  . insulin lispro protamine-lispro (HUMALOG 75/25 MIX) (75-25) 100 UNIT/ML SUSP injection Inject 57 Units into the skin 2 (two) times daily.  . methocarbamol (ROBAXIN) 500 MG tablet Take 500 mg by mouth every 8 (eight) hours as needed for muscle spasms.  Marland Kitchen. oxybutynin (DITROPAN) 5 MG tablet Take 5 mg by mouth 2 (two) times daily.  Marland Kitchen. oxyCODONE-acetaminophen (PERCOCET/ROXICET) 5-325 MG tablet Take 1 tablet by mouth every 8 (eight) hours as needed for severe pain.  . tamsulosin (FLOMAX) 0.4 MG CAPS capsule Take 0.8 mg by mouth daily after supper.   . traMADol (ULTRAM) 50 MG tablet Take 50-100 mg by mouth every 6 (six) hours as needed for moderate pain.  Marland Kitchen. aspirin 81 MG tablet Take 81 mg by mouth daily.    Current Facility-Administered Medications:  .   stroke: mapping our early stages of recovery book, , Does not apply, Once, Aroor, Georgiana SpinnerSushanth R, MD .  acetaminophen (TYLENOL) tablet 650 mg, 650 mg, Oral, Q4H PRN **OR** acetaminophen (TYLENOL) solution 650 mg, 650 mg, Per Tube, Q4H PRN **OR**  acetaminophen (TYLENOL) suppository 650 mg, 650 mg, Rectal, Q4H PRN, Aroor, Georgiana Spinner R, MD .  albuterol (PROVENTIL) (2.5 MG/3ML) 0.083% nebulizer solution 2.5 mg, 2.5 mg, Nebulization, Q2H PRN, Marcelle Smiling, MD, 2.5 mg at 11/28/17 0116 .  Ampicillin-Sulbactam (UNASYN) 3 g in sodium chloride 0.9 % 100 mL IVPB, 3 g, Intravenous, Q6H, Rumbarger, Faye Ramsay, RPH, Stopped at 11/30/17 0410 .   aspirin suppository 300 mg, 300 mg, Rectal, Daily, Rejeana Brock, MD, 300 mg at 11/29/17 1114 .  budesonide (PULMICORT) nebulizer solution 0.5 mg, 0.5 mg, Nebulization, BID, Hammonds, Curt Jews, MD, 0.5 mg at 11/30/17 0820 .  chlorhexidine (PERIDEX) 0.12 % solution 15 mL, 15 mL, Mouth Rinse, BID, Micki Riley, MD, 15 mL at 11/29/17 2230 .  dexmedetomidine (PRECEDEX) 200 MCG/50ML (4 mcg/mL) infusion, 0.2-1.4 mcg/kg/hr, Intravenous, Continuous, Hammonds, Curt Jews, MD, Last Rate: 7.7 mL/hr at 11/30/17 0800, 0.302 mcg/kg/hr at 11/30/17 0800 .  enalaprilat (VASOTEC) injection 1.25 mg, 1.25 mg, Intravenous, Q6H, Marcelle Smiling, MD, 1.25 mg at 11/30/17 0534 .  feeding supplement (OSMOLITE 1.5 CAL) liquid 1,000 mL, 1,000 mL, Per Tube, Continuous, Marcelle Smiling, MD .  feeding supplement (PRO-STAT SUGAR FREE 64) liquid 30 mL, 30 mL, Per Tube, BID, Marcelle Smiling, MD .  folic acid (FOLVITE) tablet 1 mg, 1 mg, Per Tube, Daily, Selmer Dominion B, NP .  hydrALAZINE (APRESOLINE) injection 10 mg, 10 mg, Intravenous, Q4H PRN, Selmer Dominion B, NP .  insulin aspart (novoLOG) injection 0-15 Units, 0-15 Units, Subcutaneous, Q4H, Tobey Grim, NP, 3 Units at 11/30/17 [phone] .  ipratropium-albuterol (DUONEB) 0.5-2.5 (3) MG/3ML nebulizer solution 3 mL, 3 mL, Nebulization, Q4H, Hammonds, Curt Jews, MD, 3 mL at 11/30/17 0818 .  LORazepam (ATIVAN) injection 2 mg, 2 mg, Intravenous, Q2H PRN, Selmer Dominion B, NP, 2 mg at 11/30/17 0042 .  MEDLINE mouth rinse, 15 mL, Mouth Rinse, q12n4p, Micki Riley, MD, 15 mL at 11/29/17 1622 .  multivitamin liquid 15 mL, 15 mL, Per Tube, Daily, Selmer Dominion B, NP .  nicardipine (CARDENE) 20mg  in 0.86% saline IV infusion (0.1 mg/ml), 3-15 mg/hr, Intravenous, Continuous, Lynnell Jude, MD, Stopped at 11/29/17 0345 .  pantoprazole (PROTONIX) EC tablet 40 mg, 40 mg, Oral, QHS, Simpson, Paula B, NP .  potassium chloride 10 mEq in 100 mL IVPB, 10 mEq,  Intravenous, Q1 Hr x 4, Simpson, Paula B, NP, Last Rate: 100 mL/hr at 11/30/17 0823, 10 mEq at 11/30/17 0823 .  potassium chloride 20 MEQ/15ML (10%) solution 40 mEq, 40 mEq, Per Tube, Once, Selmer Dominion B, NP .  thiamine (VITAMIN B-1) tablet 100 mg, 100 mg, Per Tube, Daily, Selmer Dominion B, NP .  vancomycin (VANCOCIN) IVPB 1000 mg/200 mL premix, 1,000 mg, Intravenous, Q12H, Rumbarger, Faye Ramsay, RPH, Stopped at 11/30/17 0111  OBJECTIVE:   VITAL SIGNS: BP (!) 152/72 (BP Location: Right Arm)   Pulse (!) 49   Temp 98.9 F (37.2 C) (Axillary)   Resp (!) 28   Ht 6' (1.829 m)   Wt 99.6 kg (219 lb 9.3 oz)   SpO2 100%   BMI 29.78 kg/m    INTAKE / OUTPUT: I/O last 3 completed shifts: In: 1443.7 [I.V.:1023.7; Other:20; IV Piggyback:400] Out: 4000 [Urine:3900; Emesis/NG output:100]  PHYSICAL EXAMINATION: General: Somnolent. Unable to assess orientation due to dysphasia/aphasia. Neuro: L gaze preference. Pupils equal, 2 mm. R Babinksi. DTR 1+ @ RUE, 2+ @ LUE, 1+ @ RLE, 1+ @ LLE. Neck: Right carotid endarterectomy scar.  There is no JVD  CHEST: Symmetric in development and expansion. Diminished air entry. B rales and rhonchi. Regular S1 and S2 without murmur, rub or gallop. ABDOMEN: obese, soft and nontender. no organomegaly, no icterus. EXTREMITIES: no dependent edema. No clubbing. No cyanosis.  LABS:  BMET Recent Labs  Lab 11/28/17 1645 11/29/17 0449 11/30/17 0530  NA 139 142 142  K 3.3* 3.6 3.2*  CL 108 107 107  CO2 22 25 25   BUN 17 18 24*  CREATININE 0.68 0.78 0.68  GLUCOSE 159* 146* 181*    Electrolytes Recent Labs  Lab 11/28/17 1645 11/28/17 2206 11/29/17 0449 11/29/17 1655 11/30/17 0530  CALCIUM 9.1  --  9.0  --  9.0  MG  --  1.6* 2.1 1.8 2.0  PHOS  --  2.2*  --  2.0* 3.0    CBC Recent Labs  Lab 11/26/17 0524 11/28/17 2206 11/29/17 1211  WBC 10.4 13.0* 10.6*  HGB 13.7 14.4 14.3  HCT 41.4 43.1 42.4  PLT 268 247 230    Coag's Recent Labs  Lab  12/02/2017 1441  APTT 26  INR 1.10    Sepsis Markers Recent Labs  Lab 11/28/17 2206 11/29/17 1211  LATICACIDVEN 1.4  --   PROCALCITON  --  0.16    ABG Recent Labs  Lab 11/28/17 1404 11/28/17 2200 11/29/17 1006  PHART 7.431 7.388 7.405  PCO2ART 32.8 40.3 42.4  PO2ART 78.0* 239.0* 223.0*    Liver Enzymes Recent Labs  Lab 12/15/2017 1441 11/28/17 1645  AST 20 37  ALT 19 40  ALKPHOS 73 76  BILITOT 0.7 1.1  ALBUMIN 3.4* 3.0*    Cardiac Enzymes No results for input(s): TROPONINI, PROBNP in the last 168 hours.  Glucose Recent Labs  Lab 11/29/17 1223 11/29/17 1514 11/29/17 1917 11/29/17 2348 11/30/17 0357 11/30/17 0825  GLUCAP 151* 150* 142* 156* 190* 157*    Imaging  No results found.  ECHO (11/26/2017) - Left ventricle: Inferobasal hypokinesis. Wall thickness was increased in a pattern of mild LVH. Systolic function was normal.  The estimated ejection fraction was in the range of 50% to 55%.  Doppler parameters are consistent with both elevated ventricular end-diastolic filling pressure and elevated left atrial filling pressure. - Left atrium: The atrium was moderately dilated. - Atrial septum: Marked hypertrophy of the basal atrial septum. Likely lipomatous but consider TEE given setting of CVA. No defect or patent foramen ovale was identified.   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 Got tPA. Angiographically, no evidence of thrombus after tPA. No additional intervention was performed. Liberated from ventilator 11/26/2017.  ASSESSMENT / PLAN:  PULMONARY A: COPD with long-term tobacco abuse history ASPIRATION PNEUMONIA ABNORMAL CXR with R basilar consolidation Check ABG. With abnormal CXR and leukocytosis and altered mental status, will start antibiotic therapy for aspiration pneumonia (Unasyn/vancomycin). Continue DuoNeb/Pulmicort Keep HOB elevated 30+ degrees Airway care Place feeding tube to start nutrition.  Dietary consult for tube feed recommendations. 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  > 48 hours out from receiving tPA. Alcohol withdrawal/Delirium tremens CIWA protocol Further orders per Neurology. Wean Precedex as tolerated. Ativan PRN. Titrate to RASS 0.   METABOLIC A: type 2 diabetes with neurologic complications Hypokalemia On sliding scale insulin. Replete potassium Check BMP, Mg.   Marcelle Smiling, MD Pulmonary and Critical Care Medicine [facility] Pager: [phone]  11/30/2017, 9:00 AM

## 2017-11-30 NOTE — Progress Notes (Signed)
OT Treatment Note  Pt with improved ability to participate with OT today, assisting with grooming tasks. Increased ability to follow 1 step commands. Appears to get frustrated when therapist unable to understand pt. [facility] Will continue to follow acutely to address established goals.   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 Pt with decline since admit with HTN and bradycardia. Intubated 1/6-1/7. PMhx: carotid stenosis, CHF, DM, HTN, HLD, PAD, obesity  Precautions  Precautions Fall  Precaution Comments right hemiplegia, left gaze  Pain Assessment  Pain Assessment Faces  Faces Pain Scale 2  Pain Location pt stating LUE IV site, unable to rate  Pain Descriptors / Indicators Grimacing  Pain Intervention(s) Limited activity within patient's tolerance  Cognition  Arousal/Alertness Lethargic  Behavior During Therapy Flat affect;Impulsive  Overall Cognitive Status Impaired/Different from baseline  Area of Impairment Memory;Following commands;Safety/judgement;Attention;Awareness;Problem solving  Current Attention Level Focused  Following Commands Follows one step commands with increased time;Follows one step commands inconsistently  Safety/Judgement Decreased awareness of safety;Decreased awareness of deficits  Awareness Intellectual  Problem Solving Slow processing;Difficulty sequencing;Requires verbal cues;Requires tactile cues;Decreased initiation   General Comments pt verbalizing but most unintelligible, poor awareness and command following. pt able to follow command for thumbs up and single finger extension; able to reach and pick up flashlight on command and turn on flashlight  ADL  Overall ADL's  Needs assistance/impaired  Grooming Sitting;Maximal assistance;Wash/dry face;Oral care  Grooming Details (indicate cue type and reason) Hand over hand to initiate oral hygiene initially, then pt began using swab for mouth care. A to fully complete task  Upper Body Bathing Bed level;Maximal assistance  Lower Body Bathing Total assistance;Bed level  Upper Body Dressing  Total assistance;Bed level  Lower Body Dressing Total assistance;Bed level  Functional mobility during ADLs Maximal assistance;+2 for physical assistance  General ADL Comments Utilized bed egress to sit EOB. Pt using L hand on rail to help right self to miline at times; pt trying to communicate needs and appeared frustrated at times when therapist unable to understand.   Pt desat to high 70s after pulling his O2 off when lying flat (poor waveform, however appeared in distress); Pt became agitated, reaching out and grabbing onto therapist's arm. O2 replaced, [facility]. LUE restrained using 2 people and agitation decreased - nursing made aware.   Bed Mobility  Overal bed mobility Needs Assistance  Bed Mobility Supine to Sit  Supine to sit Max assist;HOB elevated;+2 for physical assistance  Sit to supine +2 for physical assistance;Max assist  General bed mobility comments utilized chair position into foot egrees of bed with pt requiring max assist to lift trunk from surface with maintained right lean in sitting and flexed posture  Balance  Overall balance assessment Needs assistance  Sitting-balance support Feet supported;Single extremity supported  Sitting balance-Leahy Scale Zero  Sitting balance - Comments pt holding onto rail on LUE without  pushing today, without use of rail pt leaning right  Postural control Right lateral lean;Posterior lean  Standing balance-Leahy Scale Zero  Standing  balance comment 2 person assist in standing for sacral and trunk support with right lean  Restrictions  Weight Bearing Restrictions No  Vision- Assessment  Vision Assessment? Yes;Vision impaired- to be further tested in functional context  Ocular Range of Motion Other (comment) (Gaze L. able to reach midline for short time with Max cues)  Tracking/Visual Pursuits Decreased smoothness of horizontal tracking;Impaired - to be further tested in functional context  Saccades Impaired - to be further tested in functional context  Visual Fields Right visual field deficit  Depth Perception Undershoots  Transfers  Overall transfer level Needs assistance  Transfers Sit to/from Stand  Sit to Stand Max assist;+2 physical assistance  General transfer comment max assist with right knee blocked and assist with belt and pad to stand from surface x 2 trials. Pt with physical assist to place RLE on ground as well as assist to rise. Flexed trunk with max assist and cues for extension without response of pt grossly 5 sec each trial in standing. No activation of RLE  OT - End of Session  Equipment Utilized During Treatment Gait belt;Oxygen (4L)  Activity Tolerance Patient limited by lethargy;Patient limited by fatigue  Patient left in bed;with call bell/phone within reach;with bed alarm set;with family/visitor present  Nurse Communication Mobility status  OT Assessment/Plan  OT Plan Discharge plan remains appropriate  OT Visit Diagnosis Unsteadiness on feet (R26.81);Other abnormalities of gait and mobility (R26.89);Muscle weakness (generalized) (M62.81);Hemiplegia and hemiparesis;Other symptoms and signs involving cognitive function;Low vision, both eyes (H54.2)  Hemiplegia - Right/Left Right  Hemiplegia - dominant/non-dominant Dominant  Hemiplegia - caused by  Cerebral infarction  OT Frequency (ACUTE ONLY) Min 2X/week  Follow Up Recommendations SNF;Supervision/Assistance - 24 hour  OT Equipment Other (comment) (Defer to next venue)  AM-PAC OT "6 Clicks" Daily Activity Outcome Measure  Help from another person eating meals? 1  Help from another person taking care of personal grooming? 2  Help from another person toileting, which includes using toliet, bedpan, or urinal? 1  Help from another person bathing (including washing, rinsing, drying)? 1  Help from another person to put on and taking off regular upper body clothing? 1  Help from another person to put on and taking off regular lower body clothing? 1  6 Click Score 7  ADL G Code Conversion CM  OT Goal Progression  Progress towards OT goals Progressing toward goals  Acute Rehab OT Goals  Patient Stated Goal pt did not state  OT Goal Formulation With patient  Time For Goal Achievement 12/11/17  Potential to Achieve Goals Good  ADL Goals  Pt Will Perform Grooming with min assist;sitting (supported sitting)  Pt Will Perform Upper Body Dressing with min assist;sitting (supported sitting)  Pt Will Transfer to Toilet with mod assist;stand pivot transfer;bedside commode;with +2 assist  Additional ADL Goal #1 Pt will maintain sitting for 5-7 minutes with Min A for support and balance during ADL  Additional ADL Goal #2 Pt will locate 25% of items on R side during ADL task  OT Time Calculation  OT Start Time (ACUTE ONLY) 1032  OT Stop Time (ACUTE ONLY) 1101  OT Time Calculation (min) 29 min  OT General Charges  $OT Visit 1 Visit  OT Treatments  $Self Care/Home Management  8-22 mins  [facility], OT/L  [phone] 11/30/2017

## 2017-11-30 NOTE — Assessment & Plan Note (Addendum)
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

## 2017-11-30 NOTE — Progress Notes (Signed)
NEUROHOSPITALISTS STROKE TEAM - DAILY PROGRESS NOTE   ADMISSION HISTORY: Todd DELAUGHTER is an 72 y.o. male right-handed past medical history of carotid stenosis, CHF, diabetes, hyperlipidemia, hypertension, obesity, peripheral arterial disease on aspirin presents with right sided weakness that began at 1 PM today. The patient notices right arm and leg becoming weak and had a fall around 1 PM. He waited for an hour but his symptoms did not improve and called 911. On arrival EMS noticed a right arm drift, facial droop, slurred speech. His blood pressure was 160/90. On arrival at [facility], symptoms have gotten worse and patient plegic on both right arm and right leg, left gaze preference and dysarthria. However was noticed the patient was not severely aphasic on arrival. CT head was negative for hemorrhage and patient received TPA. He denies any history of hematuria/ GI bleeding/recent surgery. CT angiogram and CT perfusion were obtained. CT perfusion showed no perfusion deficit, however CT angiogram showed a M2 occlusion. Given patient's right-sided deficits, gaze deviation patient was deemed a candidate for thrombectomy. Absence of perfusion deficit in the left MCA, no significant aphasia- consider doing a stat MRI however this was delayed life-saving treatment and decided to proceed to angio suite.   Date last known well: 2017-12-20 Time last known well: 1pm tPA Given: yes NIHSS: 17 Baseline MRS 0  SUBJECTIVE (INTERVAL HISTORY) No family member is at the bedside.He continues to intermittently agitated and had to be switched from phenobarbital to Ativan and Precedex for sedation and has been off Cardene drip for blood pressure control.  He seems more arousable today and offers questions. He is breathing better and is now down to nasal cannula oxygen.   OBJECTIVE Lab Results: CBC:  Recent Labs  Lab 11/26/17 0524 11/28/17 2206 11/29/17 1211  WBC  10.4 13.0* 10.6*  HGB 13.7 14.4 14.3  HCT 41.4 43.1 42.4  MCV 90.8 93.1 93.8  PLT 268 247 230   BMP: Recent Labs  Lab 11/26/17 0524  11/27/17 0434 11/28/17 0900 11/28/17 1645 11/28/17 2206 11/29/17 0449 11/29/17 1655 11/30/17 0530  NA 138  --  140 141 139  --  142  --  142  K 2.9*   < > 3.8 3.7 3.3*  --  3.6  --  3.2*  CL 103  --  106 108 108  --  107  --  107  CO2 25  --  19* 24 22  --  25  --  25  GLUCOSE 188*  --  151* 192* 159*  --  146*  --  181*  BUN 16  --  17 17 17   --  18  --  24*  CREATININE 0.87  --  0.69 0.70 0.68  --  0.78  --  0.68  CALCIUM 8.5*  --  9.2 9.2 9.1  --  9.0  --  9.0  MG 1.8  --  1.8 1.7  --  1.6* 2.1 1.8 2.0  PHOS 4.2  --   --   --   --  2.2*  --  2.0* 3.0   < > = values in this interval not displayed.   Liver Function Tests:  Recent Labs  Lab 20-Dec-2017 1441 11/28/17 1645  AST 20 37  ALT 19 40  ALKPHOS 73 76  BILITOT 0.7 1.1  PROT 7.4 6.6  ALBUMIN 3.4* 3.0*   Thyroid Function Studies:  No results for input(s): TSH, T4TOTAL, T3FREE, THYROIDAB in the last 72 hours. Coagulation Studies:  No results for input(s): APTT, INR in the last 72 hours. PHYSICAL EXAM Temp:  [97.9 F (36.6 C)-100.2 F (37.9 C)] 98.7 F (37.1 C) (01/11 1600) Pulse Rate:  [44-67] 62 (01/11 1400) Resp:  [14-34] 30 (01/11 1400) BP: (127-189)/(51-86) 181/78 (01/11 1400) SpO2:  [81 %-100 %] 92 % (01/11 1653) Weight:  [219 lb 9.3 oz (99.6 kg)] 219 lb 9.3 oz (99.6 kg) (01/11 0400) General - Well nourished, well developed elderly Caucasian male, in no apparent distress,  HEENT-  Normocephalic, Normal external eye/conjunctiva.  Normal external ears. Normal external nose, mucus membranes and septum.   Cardiovascular - Regular rate and rhythm  Respiratory - Lungs clear bilaterally. No wheezing. Abdomen - soft and non-tender, BS normal Extremities- no edema or cyanosis Neurological Examination Mental Status: sleepy but can be aroused to open eyes. Nonverbal. Follows only  very simple commands. Dysarthric Cranial Nerves: II: Visual fields : Chronic right eye blindness , left homonymous hemianopsia  III,IV, VI: ptosis not present, forced deviation to left side. pupils equal, round, reactive to light and accommodation V,VII RIGHT FACIAL DROOP  VIII: hearing normal bilaterally IX,X: uvula rises symmetrically XI: bilateral shoulder shrug XII: midline tongue extension Motor: Right :  Upper extremity   0/5                                      Left:     Upper extremity   5/5             Lower extremity   0/5                                                  Lower extremity   5/5 Tone and bulk:normal tone throughout; no atrophy noted Sensory: Reduced sensation on right side , +neglect  Plantars: Right: downgoing                                Left: downgoing Gait:Not tested  IMAGING: I have personally reviewed the radiological images below and agree with the radiology interpretations. Ct Angio Head and Neck W Or Wo Contrast with Perfusion Result Date: 2017/12/04 IMPRESSION: 1. Proximal left M2 superior division occlusion with partial distal reconstitution. 2. Extensive ICA siphon atherosclerosis with moderate to severe stenoses bilaterally. 3. 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 Occlusion of the nondominant left vertebral artery distal to PICA. Possible severe right V4 stenoses, not well evaluated. 4. Moderate to severe proximal left PCA stenoses. 5. Patent cervical carotid arteries without significant stenosis. Prior right carotid endarterectomy. 6. Aortic Atherosclerosis (ICD10-I70.0) and Emphysema (ICD10-J43.9). 7. Moderate-sized, loculated right pleural effusion with pleural thickening suggesting chronicity. 8. 11 mm part solid pulmonary nodule in the left upper lobe.  Ct Head Wo Contrast Result Date: Dec 04, 2017 IMPRESSION: 1. No intracranial hemorrhage. 2. Unchanged appearance of the brain. No evidence of new or enlarging  infarct.   Mr Brain Wo Contrast Result Date: 11/26/2017 IMPRESSION: 1. Acute/early subacute infarction involving left posterior limb of internal capsule extending into left anterior parahippocampal gyrus and hippocampus. Additional punctate infarct within left inferior cerebellar hemisphere. No hemorrhage or mass effect. 2. Background of mild chronic microvascular ischemic changes and mild parenchymal volume loss  of the brain. 3. 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 4. Mild paranasal sinus disease.   Dg Chest Port 1 View Result Date: 11/22/2017 IMPRESSION: Right pleural effusion and lung opacity, consistent with atelectasis or consolidation.  Ct Head Code Stroke Wo Contrast Result Date: 12/02/2017 IMPRESSION: 1. Atrophy and small vessel disease. Cannot exclude early LEFT posterior limb internal capsule infarct. Chronic insult LEFT cerebellum. 2. ASPECTS is 9.   Echocardiogram:                                              Left ventricle: Inferobasal hypokinesis. Wall thickness was   increased in a pattern of mild LVH. Systolic function was normal.   The estimated ejection fraction was in the range of 50% to 55%.   Doppler parameters are consistent with both elevated ventricular   end-diastolic filling pressure and elevated left atrial filling   pressure.     IMPRESSION: [name] is a 72 y.o. male with PMH of carotid stenosis, CHF, diabetes, hyperlipidemia, hypertension, obesity, peripheral arterial disease on aspirin presents with acute onset of right sided weakness, which progressed to result in hemiplegia.  CT head was negative for hemorrhage and patient received IV tPA. CT perfusion showed no perfusion deficit, however CT angiogram showed a M2 occlusion. Given patient's right-sided deficits, gaze deviation patient was deemed a candidate for thrombectomy. MRI reveals: Acute/early subacute infarction Left posterior limb of internal capsule extending into left  anterior parahippocampal gyrus and hippocampus Infarct within Left inferior cerebellar hemisphere.  Treated with IV TPA  now persistent agitation and confusion likely due to superimposed alcohol withdrawal STROKE:  Suspected Etiology: Left M2 occlusion and  possibly complication of catheter angiogram  Resultant Symptoms: right-sided deficits, gaze deviation  Stroke Risk Factors: diabetes mellitus, hyperlipidemia and hypertension Other Stroke Risk Factors: Advanced age, Cigarette smoker, ETOH use, Obesity, Body mass index is 29.78 kg/m. , Hx stroke, OSA/likely undx, CHF, PAD  PROCEDURES: 12/11/2017  Dr Corliss Skains S/P 4 vessel cerebral arteriogram Rt CFA approach. Findings. 1.No occlusions,stenosis or dissections extracranially or intracranially. Occluded Rt common femoral  Below the inguinal ligament  With distal recostitution of the profuda from prominent collaterals and  brisk run off.    11/30/2017 ASSESSMENT:   Patient remains  agitated and sedated.  Following only minimal commands.  Nonverbal.  No movement of right side.  CCM following.  Appreciate assistance.     We will continue to follow closely.  PLAN  11/30/2017:  Continue sedation as per pulmonary critical care M.D. for suspected alcohol withdrawal Pass panda tube and start tube feeds Frequent neuro checks Telemetry monitoring PT/OT/SLP Ongoing aggressive stroke risk factor management Patient will be counseled to be compliant with his antithrombotic medications Patient will be counseled on Lifestyle modifications including, Diet, Exercise, and Stress Follow up with [facility] in 6 weeks  HX OF STROKES: Small chronic infarct within left middle cerebellar peduncle  INTRACRANIAL Atherosclerosis &Stenosis: May consider DAPT prior to discharge  DYSPHAGIA: NPO until passes SLP swallow evaluation Aspiration Precautions in progress  Medical issues per CCM team Agitation due to presumed alcohol  withdrawal Hypotension Hypokalemia Hypocalcemia  Pulmonary nodule 11 mm part solid pulmonary nodule in the left upper lobe Outpatient follow-up CT recommended at 3-6 months to confirm persistence If unchanged, and solid component remains <6 mm, annual CT until 5 years  of stability  If persistent these nodules should be considered highly suspicious if the solid component of the nodule is 6 mm or greater in size and enlarging.   HYPERTENSION: Stable SBP goal of < 180. DBP goal of < 105.  Nicardipine drip, Labetolol PRN Long term BP goal normotensive. May slowly start B/P medications after 48 hours, if applicable Home Meds: NONE  HYPERLIPIDEMIA:    Component Value Date/Time   CHOL 168 11/26/2017 0524   TRIG 96 11/26/2017 0524   HDL 44 11/26/2017 0524   CHOLHDL 3.8 11/26/2017 0524   VLDL 19 11/26/2017 0524   LDLCALC 105 (H) 11/26/2017 0524  Home Meds:  NONE LDL  goal < 70 Will started on  Lipitor to 40 mg daily, once patient passes SLP evaluation Continue statin at discharge  DIABETES: Lab Results  Component Value Date   HGBA1C 7.6 (H) 11/26/2017  HgbA1c goal < 7.0 Currently on: NovoLog Continue CBG monitoring and SSI to maintain glucose 140-180 mg/dl DM education   TOBACCO ABUSE and Likely ETOH Abuse Current smoker Smoking cessation counseling provided Nicotine patch provided CIWA, as needed  OBESITY Obesity, Body mass index is 29.78 kg/m. Greater than/equal to 30  Other Active Problems: Principal Problem:   Stroke [facility](HCC) Active Problems:   Wheezing   Abnormal CXR   Hypertension, essential   Diabetes mellitus type 2 with complications (HCC)   Alcohol withdrawal delirium (HCC)   Aspiration pneumonia [facility](HCC)    Hospital day # 5 VTE prophylaxis: SCD's  Diet : Diet NPO time specified   FAMILY UPDATES: family at bedside  TEAM UPDATES: Micki RileySethi, Lallie Strahm S, MD STATUS:  FULL   Prior Home Stroke Medications:  aspirin 81 mg daily  Discharge Stroke Meds:  Please  discharge patient on TBD   Disposition:  Therapy Recs:              SNF Home Equipment:         PENDING Follow Up:  Follow-up Information    Micki RileySethi, Jariah Jarmon S, MD. Schedule an appointment as soon as possible for a visit in 6 week(s).   Specialties:  Neurology, Radiology Contact information: [address] [place] [address] [phone]          Alysia PennaHolwerda, Scott, MD -PCP Follow up in 1-2 weeks       I have personally examined this patient, reviewed notes, independently viewed imaging studies, participated in medical decision making and plan of care.ROS completed by me personally and pertinent positives fully documented  I have made any additions or clarifications directly to the above note.   Recommend  continue sedation for alcohol withdrawal as per pulmonary critical care team. Insert Panda tube and start tube feeds. No family available at the bedside for discussion today. But I spoke to patient's health power of attorney over the phone and gave him an update on the patient's condition.. Anticipate transfer to stepdown unit or the next few days. This patient is critically ill and at significant risk of neurological worsening, death and care requires constant monitoring of vital signs, hemodynamics,respiratory and cardiac monitoring, extensive review of multiple databases, frequent neurological assessment, discussion with family, other specialists and medical decision making of high complexity.I have made any additions or clarifications directly to the above note.This critical care time does not reflect procedure time, or teaching time or supervisory time of PA/NP/Med Resident etc but could involve care discussion time.  I spent 30 minutes of neurocritical care time  in the care of  this patient.     Delia Heady, MD Medical Director [facility] Stroke Center Pager: [phone] 11/30/2017 5:46 PM  To contact Stroke Continuity provider, please refer to [email or web address]. After  hours, contact General Neurology

## 2017-11-30 NOTE — Progress Notes (Signed)
SLP Cancellation Note  Patient Details Name: Todd [name] MRN: [id number] DOB: 11/06/1946   Cancelled treatment:       Reason Eval/Treat Not Completed: Fatigue/lethargy limiting ability to participate due to sedating medication which is not being weaned per the nurse.  ST will plan to follow up Monday unless paged prior to then that patient is more alert and able to participate.  Todd AguasMelissa Magin Balbi, MA, CCC-SLP Acute Rehab SLP [phone] Todd [name] 11/30/2017, 10:03 AM

## 2017-11-30 NOTE — Progress Notes (Signed)
  Speech Language Pathology Treatment: Dysphagia  Patient Details Name: Todd BlaseWilliam B Lagerstrom MRN: [id number] DOB: 03/06/1946 Today's Date: 11/30/2017 Time: [id number] SLP Time Calculation (min) (ACUTE ONLY): 11 min  Assessment / Plan / Recommendation Clinical Impression  Notified by nursing that the patient was now awake and asking for something to drink.  ST follow up for PO readiness.  Oral care was provided using suction toothbrush and patient was very accepting.   The patient was unable to follow commands to assess oral mechanism but facial droop on the right side is observed.  Given an ice chip and a spoon bolus of water no attempt to move the material from his oral cavity was seen and the material was ultimately removed utilizing suction.  Given this the patient is not ready for PO intake at this time.  Recommend that he remain NPO including medications.  ST will continue to follow for PO readiness.     HPI HPI: 72 y.o. male with PMHx carotid stenosis, CHF, DM, HTN, PAD presented with right sided weakness, left gaze preference, dysarthria. tPA administered. Underwent 4-vessel cerebral arteriogram. 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 Intubated 1/06-07.       SLP Plan  Continue with current plan of care       Recommendations  Diet recommendations: NPO Medication Administration: Via alternative means                General recommendations: (TBD) Oral Care Recommendations: Oral care QID Follow up Recommendations: Other (comment)(TBD) SLP Visit Diagnosis: Dysphagia, unspecified (R13.10) Plan: Continue with current plan of care       GO               Dimas AguasMelissa Phyllicia Dudek, MA, CCC-SLP Acute Rehab SLP [phone] Fleet ContrasMelissa N  Timm Bonenberger 11/30/2017, 11:43 AM

## 2017-11-30 NOTE — Progress Notes (Signed)
PHARMACY - PHYSICIAN COMMUNICATION CRITICAL VALUE ALERT - BLOOD CULTURE IDENTIFICATION (BCID)  Todd [name] is an 72 y.o. male who presented to [facility] on 12/13/2017 with a chief complaint of code stroke.  Assessment:  Patient is currently on antibiotics to cover aspiration pneumonia after code stroke. Blood culture -1 pediatric bottle grew GPC in clusters. BCID reported methicillin resistant staph species (Coag Neg Staph) and enterobacteriaceae species.   Name of physician (or Provider) Contacted: Dr. Ardeth PerfectJeong  Current antibiotics: Vancomycin and Unasyn  Changes to prescribed antibiotics recommended:  Patient is on recommended antibiotics - No changes needed until more definitive data.   Results for orders placed or performed during the hospital encounter of 11/28/2017  Blood Culture ID Panel (Reflexed) (Collected: 11/29/2017 12:00 PM)  Result Value Ref Range   Enterococcus species NOT DETECTED NOT DETECTED   Listeria monocytogenes NOT DETECTED NOT DETECTED   Staphylococcus species DETECTED (A) NOT DETECTED   Staphylococcus aureus NOT DETECTED NOT DETECTED   Methicillin resistance DETECTED (A) NOT DETECTED   Streptococcus species NOT DETECTED NOT DETECTED   Streptococcus agalactiae NOT DETECTED NOT DETECTED   Streptococcus pneumoniae NOT DETECTED NOT DETECTED   Streptococcus pyogenes NOT DETECTED NOT DETECTED   Acinetobacter baumannii NOT DETECTED NOT DETECTED   Enterobacteriaceae species DETECTED (A) NOT DETECTED   Enterobacter cloacae complex NOT DETECTED NOT DETECTED   Escherichia coli NOT DETECTED NOT DETECTED   Klebsiella oxytoca NOT DETECTED NOT DETECTED   Klebsiella pneumoniae NOT DETECTED NOT DETECTED   Proteus species NOT DETECTED NOT DETECTED   Serratia marcescens NOT DETECTED NOT DETECTED   Carbapenem resistance NOT DETECTED NOT DETECTED   Haemophilus influenzae NOT DETECTED NOT DETECTED   Neisseria meningitidis NOT DETECTED NOT DETECTED   Pseudomonas aeruginosa NOT  DETECTED NOT DETECTED   Candida albicans NOT DETECTED NOT DETECTED   Candida glabrata NOT DETECTED NOT DETECTED   Candida krusei NOT DETECTED NOT DETECTED   Candida parapsilosis NOT DETECTED NOT DETECTED   Candida tropicalis NOT DETECTED NOT DETECTED    Link SnufferJessica Kristilyn Coltrane, PharmD, BCPS, BCCCP Clinical Pharmacist Clinical phone 11/30/2017 until 11PM - [phone] After hours, please call [phone] 11/30/2017  4:56 PM

## 2017-11-30 NOTE — Progress Notes (Signed)
Cortrak Tube Team Note:  Consult received to place a Cortrak feeding tube.   A 10 F Cortrak tube was placed in the R nare and secured with a nasal bridle at 92 cm. Per the Cortrak monitor reading the tube tip is post pyloric.   No x-ray is required. RN may begin using tube.   If the tube becomes dislodged please keep the tube and contact the Cortrak team at [email or web address] (password [email or web address]) for replacement.  If after hours and replacement cannot be delayed, place a NG tube and confirm placement with an abdominal x-ray.    Kendell BaneHeather Bernon Arviso RD, LDN, CNSC [phone] Pager [phone] After Hours Pager

## 2017-11-30 NOTE — Progress Notes (Deleted)
Dialysis ongoing during time of NA draw. Inaccurate draw r/t dialysis settings. Will redraw once dialysis is completed. Shyna Duignan RN BSN.    

## 2017-11-30 NOTE — Progress Notes (Signed)
Physical Therapy Treatment Patient Details Name: Todd [name] MRN: [id number] DOB: [birth year] Today's Date: 11/30/2017    History of Present Illness 72 yo admitted with right weakness and fall at home, received tPA and found to have left hippocampal and cerebellar infarct. Pt with decline since admit with HTN and bradycardia. Intubated 1/6-1/7. PMhx: carotid stenosis, CHF, DM, HTN, HLD, PAD, obesity    PT Comments    Pt able to visually track today, maintaining midline and following commands with greater accuracy of the right side than on eval. Pt without pushing today but maintains right lean and required 2 person assist for standing and unable to tolerate being supine with respiratory rate increasing to 35 and SpO2 dropping to 75%. Pt with SpO2 88-95% on 4L upright throughout session. Pt agitated with return to supine to scoot to HOB grasping at [facility] and OT with wrist restraint reapplied. Will continue to follow.     Follow Up Recommendations  SNF;Supervision/Assistance - 24 hour     Equipment Recommendations  Wheelchair cushion (measurements PT);Wheelchair (measurements PT);Hospital bed    Recommendations for Other Services       Precautions / Restrictions Precautions Precautions: Fall Precaution Comments: right hemiplegia, left gaze    Mobility  Bed Mobility Overal bed mobility: Needs Assistance Bed Mobility: Supine to Sit           General bed mobility comments: utilized chair position into foot egrees of bed with pt requiring max assist to lift trunk from surface with maintained right lean in sitting and flexed posture  Transfers Overall transfer level: Needs assistance   Transfers: Sit to/from Stand Sit to Stand: Max assist;+2 physical assistance         General transfer comment: max assist with right knee blocked and assist with belt and pad to stand from surface x 2 trials. Pt with physical assist to place RLE on ground as well as assist to rise. Flexed trunk  with max assist and cues for extension without response of pt grossly 5 sec each trial in standing. No activation of RLE  Ambulation/Gait             General Gait Details: unable   Stairs            Wheelchair Mobility    Modified Rankin (Stroke Patients Only)       Balance Overall balance assessment: Needs assistance Sitting-balance support: Feet supported;Single extremity supported Sitting balance-Leahy Scale: Zero Sitting balance - Comments: pt holding onto rail on LUE without pushing today, without use of rail pt leaning right     Standing balance-Leahy Scale: Zero Standing balance comment: 2 person assist in standing for sacral and trunk support with right lean                            Cognition Arousal/Alertness: Lethargic Behavior During Therapy: Flat affect;Impulsive Overall Cognitive Status: Impaired/Different from baseline Area of Impairment: Memory;Following commands;Safety/judgement;Attention;Awareness;Problem solving                   Current Attention Level: Focused   Following Commands: Follows one step commands with increased time;Follows one step commands inconsistently Safety/Judgement: Decreased awareness of safety;Decreased awareness of deficits   Problem Solving: Slow processing;Difficulty sequencing;Requires verbal cues;Requires tactile cues;Decreased initiation General Comments: pt verbalizing but most unintelligible, poor awareness and command following. pt able to follow command for thumbs up and single finger extension      Exercises General Exercises -  Lower Extremity Long Arc Quad: AAROM;Left;5 reps;Seated    General Comments        Pertinent Vitals/Pain Pain Assessment: Faces Faces Pain Scale: Hurts a little bit Pain Location: pt stating LUE IV site, unable to rate Pain Intervention(s): Repositioned    Home Living                      Prior Function            PT Goals (current goals  can now be found in the care plan section) Progress towards PT goals: Progressing toward goals    Frequency           PT Plan Current plan remains appropriate    Co-evaluation PT/OT/SLP Co-Evaluation/Treatment: Yes Reason for Co-Treatment: Complexity of the patient's impairments (multi-system involvement);For patient/therapist safety;Necessary to address cognition/behavior during functional activity          AM-PAC PT "6 Clicks" Daily Activity  Outcome Measure  Difficulty turning over in bed (including adjusting bedclothes, sheets and blankets)?: Unable Difficulty moving from lying on back to sitting on the side of the bed? : Unable Difficulty sitting down on and standing up from a chair with arms (e.g., wheelchair, bedside commode, etc,.)?: Unable Help needed moving to and from a bed to chair (including a wheelchair)?: Total Help needed walking in hospital room?: Total Help needed climbing 3-5 steps with a railing? : Total 6 Click Score: 6    End of Session Equipment Utilized During Treatment: Gait belt Activity Tolerance: Patient tolerated treatment well Patient left: in bed;with call bell/phone within reach;with bed alarm set;with restraints reapplied Nurse Communication: Mobility status;Need for lift equipment PT Visit Diagnosis: Other abnormalities of gait and mobility (R26.89);Other symptoms and signs involving the nervous system (R29.898);Unsteadiness on feet (R26.81)     Time: [id number] PT Time Calculation (min) (ACUTE ONLY): 27 min  Charges:  $Therapeutic Activity: 8-22 mins                    G Codes:       [name], PT [phone]    [name] 11/30/2017, 11:52 AM

## 2017-12-01 ENCOUNTER — Inpatient Hospital Stay (HOSPITAL_COMMUNITY): Payer: Medicare Other

## 2017-12-01 DIAGNOSIS — E1149 Type 2 diabetes mellitus with other diabetic neurological complication: Secondary | ICD-10-CM

## 2017-12-01 DIAGNOSIS — J449 Chronic obstructive pulmonary disease, unspecified: Secondary | ICD-10-CM

## 2017-12-01 DIAGNOSIS — I6340 Cerebral infarction due to embolism of unspecified cerebral artery: Secondary | ICD-10-CM

## 2017-12-01 DIAGNOSIS — R062 Wheezing: Secondary | ICD-10-CM

## 2017-12-01 DIAGNOSIS — I639 Cerebral infarction, unspecified: Secondary | ICD-10-CM

## 2017-12-01 DIAGNOSIS — I10 Essential (primary) hypertension: Secondary | ICD-10-CM

## 2017-12-01 DIAGNOSIS — R0603 Acute respiratory distress: Secondary | ICD-10-CM

## 2017-12-01 DIAGNOSIS — F10231 Alcohol dependence with withdrawal delirium: Secondary | ICD-10-CM

## 2017-12-01 LAB — POCT I-STAT 3, ART BLOOD GAS (G3+)
ACID-BASE EXCESS: 7 mmol/L — AB (ref 0.0–2.0)
Bicarbonate: 32.7 mmol/L — ABNORMAL HIGH (ref 20.0–28.0)
O2 Saturation: 96 %
PH ART: 7.433 (ref 7.350–7.450)
PO2 ART: 84 mmHg (ref 83.0–108.0)
Patient temperature: 98.2
TCO2: 34 mmol/L — ABNORMAL HIGH (ref 22–32)
pCO2 arterial: 48.8 mmHg — ABNORMAL HIGH (ref 32.0–48.0)

## 2017-12-01 LAB — BASIC METABOLIC PANEL
ANION GAP: 11 (ref 5–15)
BUN: 26 mg/dL — ABNORMAL HIGH (ref 6–20)
CALCIUM: 8.5 mg/dL — AB (ref 8.9–10.3)
CO2: 23 mmol/L (ref 22–32)
Chloride: 110 mmol/L (ref 101–111)
Creatinine, Ser: 0.63 mg/dL (ref 0.61–1.24)
GFR calc Af Amer: >60 mL/min (ref >60)
GLUCOSE: 273 mg/dL — AB (ref 65–99)
POTASSIUM: 3.7 mmol/L (ref 3.5–5.1)
SODIUM: 144 mmol/L (ref 135–145)

## 2017-12-01 LAB — CBC
HEMATOCRIT: 40.6 % (ref 39.0–52.0)
Hemoglobin: 13.3 g/dL (ref 13.0–17.0)
MCH: 30.9 pg (ref 26.0–34.0)
MCHC: 32.8 g/dL (ref 30.0–36.0)
MCV: 94.2 fL (ref 78.0–100.0)
PLATELETS: 248 10*3/uL (ref 150–400)
RBC: 4.31 MIL/uL (ref 4.22–5.81)
RDW: 13.5 % (ref 11.5–15.5)
WBC: 8.4 10*3/uL (ref 4.0–10.5)

## 2017-12-01 LAB — MAGNESIUM: MAGNESIUM: 2 mg/dL (ref 1.7–2.4)

## 2017-12-01 LAB — GLUCOSE, CAPILLARY
GLUCOSE-CAPILLARY: 268 mg/dL — AB (ref 65–99)
GLUCOSE-CAPILLARY: 259 mg/dL — AB (ref 65–99)
Glucose-Capillary: 246 mg/dL — ABNORMAL HIGH (ref 65–99)
Glucose-Capillary: 331 mg/dL — ABNORMAL HIGH (ref 65–99)
Glucose-Capillary: 234 mg/dL — ABNORMAL HIGH (ref 65–99)

## 2017-12-01 LAB — PHOSPHORUS: Phosphorus: 3.2 mg/dL (ref 2.5–4.6)

## 2017-12-01 LAB — PROCALCITONIN: Procalcitonin: <0.1 ng/mL

## 2017-12-01 MED ORDER — IPRATROPIUM-ALBUTEROL 0.5-2.5 (3) MG/3ML IN SOLN
3.0000 mL | Freq: Three times a day (TID) | RESPIRATORY_TRACT | Status: DC
  Administered 2017-12-02 (×2): 3 mL via RESPIRATORY_TRACT
  Filled 2017-12-01 (×2): qty 3

## 2017-12-01 MED ORDER — ATORVASTATIN CALCIUM 20 MG PO TABS
20.0000 mg | ORAL_TABLET | Freq: Every day | ORAL | Status: DC
  Administered 2017-12-02: 20 mg via ORAL
  Filled 2017-12-01: qty 1

## 2017-12-01 MED ORDER — ENALAPRIL MALEATE 10 MG PO TABS
10.0000 mg | ORAL_TABLET | Freq: Two times a day (BID) | ORAL | Status: DC
  Administered 2017-12-01: 10 mg via ORAL
  Filled 2017-12-01 (×2): qty 1

## 2017-12-01 MED ORDER — AMLODIPINE BESYLATE 5 MG PO TABS: 5.0000 mg | ORAL_TABLET | Freq: Every day | ORAL | Status: DC

## 2017-12-01 MED ORDER — LABETALOL HCL 5 MG/ML IV SOLN
10.0000 mg | INTRAVENOUS | Status: DC | PRN
  Administered 2017-12-01: 10 mg via INTRAVENOUS
  Filled 2017-12-01: qty 4

## 2017-12-01 MED ORDER — POTASSIUM CHLORIDE 20 MEQ/15ML (10%) PO SOLN
40.0000 meq | Freq: Every day | ORAL | Status: AC
Start: 2017-12-01 — End: 2017-12-01
  Administered 2017-12-01: 40 meq via ORAL
  Filled 2017-12-01: qty 30

## 2017-12-01 MED ORDER — HYDRALAZINE HCL 20 MG/ML IJ SOLN
10.0000 mg | INTRAMUSCULAR | Status: DC | PRN
  Administered 2017-12-01 – 2017-12-02 (×3): 10 mg via INTRAVENOUS
  Filled 2017-12-01 (×3): qty 1

## 2017-12-01 MED ORDER — AMLODIPINE BESYLATE 10 MG PO TABS
10.0000 mg | ORAL_TABLET | Freq: Every day | ORAL | Status: DC
  Administered 2017-12-01: 10 mg via ORAL
  Filled 2017-12-01: qty 1

## 2017-12-01 MED ORDER — FUROSEMIDE 10 MG/ML IJ SOLN
40.0000 mg | Freq: Two times a day (BID) | INTRAMUSCULAR | Status: AC
  Administered 2017-12-01 (×2): 40 mg via INTRAVENOUS
  Filled 2017-12-01 (×2): qty 4

## 2017-12-01 MED ORDER — CITALOPRAM HYDROBROMIDE 10 MG PO TABS
40.0000 mg | ORAL_TABLET | Freq: Every day | ORAL | Status: DC
  Administered 2017-12-02: 40 mg via ORAL
  Filled 2017-12-01: qty 4

## 2017-12-01 MED ORDER — NICARDIPINE HCL IN NACL 20-0.86 MG/200ML-% IV SOLN
INTRAVENOUS | Status: AC
  Filled 2017-12-01: qty 200

## 2017-12-01 MED ORDER — TAMSULOSIN HCL 0.4 MG PO CAPS: 0.8000 mg | ORAL_CAPSULE | Freq: Every day | ORAL | Status: DC

## 2017-12-01 MED ORDER — ASPIRIN EC 325 MG PO TBEC: 325.0000 mg | DELAYED_RELEASE_TABLET | Freq: Every day | ORAL | Status: DC

## 2017-12-01 MED ORDER — LABETALOL HCL 5 MG/ML IV SOLN: 10.0000 mg | INTRAVENOUS | Status: DC | PRN

## 2017-12-01 NOTE — Progress Notes (Signed)
SLP Cancellation Note  Patient Details Name: Todd [name] MRN: [id number] DOB: 08/02/1946   Cancelled treatment:       Reason Eval/Treat Not Completed: Medical issues which prohibited therapy. Patient remains lethargic per RN, on venti-mask. Holding po trials until patient becomes more alert and respiratory status improves.   Ferdinand LangoLeah Benancio Osmundson MA, CCC-SLP [phone]    Ferdinand LangoMcCoy Aleana Fifita [name] 12/01/2017, 10:58 AM

## 2017-12-01 NOTE — Progress Notes (Signed)
NEUROHOSPITALISTS STROKE TEAM - DAILY PROGRESS NOTE   SUBJECTIVE (INTERVAL HISTORY) No family member is at the bedside. He continues to have SOB but ABG has been normal. Pt lethargic but awake alert and following simple commands. Still has right hemiplegia. On precedex, BP stable on the high side.      OBJECTIVE Lab Results: CBC:  Recent Labs  Lab 11/26/17 0524 11/28/17 2206 11/29/17 1211  WBC 10.4 13.0* 10.6*  HGB 13.7 14.4 14.3  HCT 41.4 43.1 42.4  MCV 90.8 93.1 93.8  PLT 268 247 230   BMP: Recent Labs  Lab 11/26/17 0524  11/27/17 0434 11/28/17 0900 11/28/17 1645 11/28/17 2206 11/29/17 0449 11/29/17 1655 11/30/17 0530 11/30/17 1622  NA 138  --  140 141 139  --  142  --  142  --   K 2.9*   < > 3.8 3.7 3.3*  --  3.6  --  3.2*  --   CL 103  --  106 108 108  --  107  --  107  --   CO2 25  --  19* 24 22  --  25  --  25  --   GLUCOSE 188*  --  151* 192* 159*  --  146*  --  181*  --   BUN 16  --  17 17 17   --  18  --  24*  --   CREATININE 0.87  --  0.69 0.70 0.68  --  0.78  --  0.68  --   CALCIUM 8.5*  --  9.2 9.2 9.1  --  9.0  --  9.0  --   MG 1.8  --  1.8 1.7  --  1.6* 2.1 1.8 2.0 1.9  PHOS 4.2  --   --   --   --  2.2*  --  2.0* 3.0 2.5   < > = values in this interval not displayed.   Liver Function Tests:  Recent Labs  Lab 12/05/2017 1441 11/28/17 1645  AST 20 37  ALT 19 40  ALKPHOS 73 76  BILITOT 0.7 1.1  PROT 7.4 6.6  ALBUMIN 3.4* 3.0*   Thyroid Function Studies:  No results for input(s): TSH, T4TOTAL, T3FREE, THYROIDAB in the last 72 hours. Coagulation Studies:  No results for input(s): APTT, INR in the last 72 hours. PHYSICAL EXAM Temp:  [97.8 F (36.6 C)-98.7 F (37.1 C)] 98.5 F (36.9 C) (01/12 0423) Pulse Rate:  [49-76] 67 (01/12 0726) Resp:  [22-41] 26 (01/12 0726) BP: (101-191)/(64-89) 161/68 (01/12 0726) SpO2:  [88 %-100 %] 98 % (01/12 0726) FiO2 (%):  [45 %] 45 % (01/12 0726) Weight:   [222 lb 0.1 oz (100.7 kg)] 222 lb 0.1 oz (100.7 kg) (01/12 0300) General - Well nourished, well developed elderly Caucasian male, in no apparent distress,  HEENT-  Normocephalic, Normal external eye/conjunctiva.  Normal external ears. Normal external nose, mucus membranes and septum.   Cardiovascular - Regular rate and rhythm  Respiratory - Lungs clear bilaterally. No wheezing. Abdomen - soft and non-tender, BS normal Extremities- no edema or cyanosis Neurological Examination Mental Status: sleepy but can be aroused to open eyes. Nonverbal. Follows only very simple commands. Dysarthric Cranial Nerves: II: Visual fields : Chronic right eye blindness , left homonymous hemianopsia  III,IV, VI: ptosis not present, forced deviation to left side. pupils equal, round, reactive to light and accommodation V,VII RIGHT FACIAL DROOP  VIII: hearing normal bilaterally IX,X: uvula rises symmetrically XI: bilateral shoulder  shrug XII: midline tongue extension Motor: Right :  Upper extremity   0/5                                      Left:     Upper extremity   5/5             Lower extremity   0/5                                                  Lower extremity   5/5 Tone and bulk:normal tone throughout; no atrophy noted Sensory: Reduced sensation on right side , +neglect  Plantars: Right: downgoing                                Left: downgoing Gait:Not tested  IMAGING: I have personally reviewed the radiological images below and agree with the radiology interpretations. Ct Angio Head and Neck W Or Wo Contrast with Perfusion Result Date: 12-23-17 IMPRESSION: 1. Proximal left M2 superior division occlusion with partial distal reconstitution. 2. Extensive ICA siphon atherosclerosis with moderate to severe stenoses bilaterally. 3. 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 Occlusion of the nondominant left vertebral artery distal to PICA. Possible severe right V4 stenoses,  not well evaluated. 4. Moderate to severe proximal left PCA stenoses. 5. Patent cervical carotid arteries without significant stenosis. Prior right carotid endarterectomy. 6. Aortic Atherosclerosis (ICD10-I70.0) and Emphysema (ICD10-J43.9). 7. Moderate-sized, loculated right pleural effusion with pleural thickening suggesting chronicity. 8. 11 mm part solid pulmonary nodule in the left upper lobe.  Ct Head Wo Contrast Result Date: 2017/12/23 IMPRESSION: 1. No intracranial hemorrhage. 2. Unchanged appearance of the brain. No evidence of new or enlarging infarct.   Mr Brain Wo Contrast Result Date: 11/26/2017 IMPRESSION: 1. Acute/early subacute infarction involving left posterior limb of internal capsule extending into left anterior parahippocampal gyrus and hippocampus. Additional punctate infarct within left inferior cerebellar hemisphere. No hemorrhage or mass effect. 2. Background of mild chronic microvascular ischemic changes and mild parenchymal volume loss of the brain. 3. 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 4. Mild paranasal sinus disease.   Dg Chest Port 1 View Result Date: 12-23-17 IMPRESSION: Right pleural effusion and lung opacity, consistent with atelectasis or consolidation.  Ct Head Code Stroke Wo Contrast Result Date: 12/23/2017 IMPRESSION: 1. Atrophy and small vessel disease. Cannot exclude early LEFT posterior limb internal capsule infarct. Chronic insult LEFT cerebellum. 2. ASPECTS is 9.   Echocardiogram:                                              Left ventricle: Inferobasal hypokinesis. Wall thickness was   increased in a pattern of mild LVH. Systolic function was normal.   The estimated ejection fraction was in the range of 50% to 55%.   Doppler parameters are consistent with both elevated ventricular   end-diastolic filling pressure and elevated left atrial filling   pressure.     IMPRESSION: Mr. MELISSA PULIDO is a 72 y.o. male with PMH of  carotid stenosis, CHF,  diabetes, hyperlipidemia, hypertension, obesity, peripheral arterial disease on aspirin presents with acute onset of right sided weakness, which progressed to result in hemiplegia.  CT head was negative for hemorrhage and patient received IV tPA. CT perfusion showed no perfusion deficit, however CT angiogram showed a M2 occlusion. Given patient's right-sided deficits, gaze deviation patient was deemed a candidate for thrombectomy. MRI reveals: Acute/early subacute infarction Left posterior limb of internal capsule extending into left anterior parahippocampal gyrus and hippocampus Infarct within Left inferior cerebellar hemisphere.  Treated with IV TPA  now persistent agitation and confusion likely due to superimposed alcohol withdrawal STROKE:  Suspected Etiology: Left M2 occlusion and  possibly complication of catheter angiogram  Resultant Symptoms: right-sided deficits, gaze deviation  Stroke Risk Factors: diabetes mellitus, hyperlipidemia and hypertension Other Stroke Risk Factors: Advanced age, Cigarette smoker, ETOH use, Obesity, Body mass index is 30.11 kg/m. , Hx stroke, OSA/likely undx, CHF, PAD  PROCEDURES: 11/30/2017  Dr Corliss Skains S/P 4 vessel cerebral arteriogram Rt CFA approach. Findings. 1.No occlusions,stenosis or dissections extracranially or intracranially. Occluded Rt common femoral  Below the inguinal ligament  With distal recostitution of the profuda from prominent collaterals and  brisk run off.    12/01/2017 ASSESSMENT:   Patient remains  agitated and sedated.  Following only minimal commands.  Nonverbal.  No movement of right side.  CCM following.  Appreciate assistance.     We will continue to follow closely.  PLAN  12/01/2017:  Continue sedation as per pulmonary critical care M.D. for suspected alcohol withdrawal Pass panda tube and start tube feeds Frequent neuro checks Telemetry monitoring PT/OT/SLP Ongoing aggressive stroke risk factor  management Patient will be counseled to be compliant with his antithrombotic medications Patient will be counseled on Lifestyle modifications including, Diet, Exercise, and Stress Follow up with [facility] in 6 weeks  HX OF STROKES: Small chronic infarct within left middle cerebellar peduncle  INTRACRANIAL Atherosclerosis &Stenosis: May consider DAPT prior to discharge  DYSPHAGIA: NPO until passes SLP swallow evaluation Aspiration Precautions in progress  Medical issues per CCM team Agitation due to presumed alcohol withdrawal Hypotension Hypokalemia Hypocalcemia  Pulmonary nodule 11 mm part solid pulmonary nodule in the left upper lobe Outpatient follow-up CT recommended at 3-6 months to confirm persistence If unchanged, and solid component remains <6 mm, annual CT until 5 years of stability  If persistent these nodules should be considered highly suspicious if the solid component of the nodule is 6 mm or greater in size and enlarging.   HYPERTENSION: Stable SBP goal of < 180. DBP goal of < 105.  Nicardipine drip, Labetolol PRN Long term BP goal normotensive. May slowly start B/P medications after 48 hours, if applicable Home Meds: NONE  HYPERLIPIDEMIA:    Component Value Date/Time   CHOL 168 11/26/2017 0524   TRIG 96 11/26/2017 0524   HDL 44 11/26/2017 0524   CHOLHDL 3.8 11/26/2017 0524   VLDL 19 11/26/2017 0524   LDLCALC 105 (H) 11/26/2017 0524  Home Meds:  NONE LDL  goal < 70 Will started on  Lipitor to 40 mg daily, once patient passes SLP evaluation Continue statin at discharge  DIABETES: Lab Results  Component Value Date   HGBA1C 7.6 (H) 11/26/2017  HgbA1c goal < 7.0 Currently on: NovoLog Continue CBG monitoring and SSI to maintain glucose 140-180 mg/dl DM education   TOBACCO ABUSE and Likely ETOH Abuse Current smoker Smoking cessation counseling provided Nicotine patch provided CIWA, as needed  OBESITY Obesity, Body mass index  is 30.11 kg/m. Greater than/equal to 30  Other Active Problems: Principal Problem:   Acute ischemic left middle cerebral artery (MCA) stroke (HCC) Active Problems:   COPD with respiratory distress, acute (HCC)   Abnormal CXR   Hypertension, essential   DM (diabetes mellitus), type 2 with neurological complications (HCC)   Alcohol withdrawal delirium (HCC)   Aspiration pneumonia [facility](HCC)    Hospital day # 6 VTE prophylaxis: SCD's  Diet : Diet NPO time specified   FAMILY UPDATES: family at bedside  TEAM UPDATES: Marvel PlanXu, Eniyah Eastmond, MD STATUS:  FULL   Prior Home Stroke Medications:  aspirin 81 mg daily  Discharge Stroke Meds:  Please discharge patient on TBD   Disposition:  Therapy Recs:              SNF Home Equipment:         PENDING Follow Up:  Follow-up Information    Micki RileySethi, Pramod S, MD. Schedule an appointment as soon as possible for a visit in 6 week(s).   Specialties:  Neurology, Radiology Contact information: [address] [place] [address] [phone]          Alysia PennaHolwerda, Scott, MD -PCP Follow up in 1-2 weeks       ATTENDING NOTE:  I have personally examined this patient, reviewed notes, independently viewed imaging studies, participated in medical decision making and plan of care.ROS completed by me personally and pertinent positives fully documented  I have made any additions or clarifications directly to the above note.     72 year old male with history of R carotid stenosis s/p CEA, CHF, DM, HLD, HTN, PAD admitted for right-sided weakness, slurred speech, left gaze.  Status post TPA.  CT head and neck negative but questionable left M2 stenosis.  INR attempted but no vascular stenosis seen.  MRI showed left PICA/thalamus, left medial temporal lobe, left punctate cerebellum infarct.  EF 50-55%.  A1c 7.6 and LDL 105.  His symptoms initially stabilized, however later, developed agitation, confusion, difficulty breathing, concerning for alcohol  withdrawal.  He was put on Precedex, with benzoyl as needed. Continue CCM care, aspirin and BP control. CBG monitoring. Still on unasyn and vanco.   Consider TEE and loop once stable if no stroke cause found.   Marvel PlanJindong Tatsuya Okray, MD PhD Stroke Neurology 12/01/2017 9:39 PM   This patient is critically ill and at significant risk of neurological worsening, death and care requires constant monitoring of vital signs, hemodynamics,respiratory and cardiac monitoring, extensive review of multiple databases, frequent neurological assessment, discussion with family, other specialists and medical decision making of high complexity. Discussed with Dr. Ardeth PerfectJeong. I spent 40 minutes of neurocritical care time  in the care of  this patient.    To contact Stroke Continuity provider, please refer to [email or web address]. After hours, contact General Neurology

## 2017-12-01 NOTE — Progress Notes (Addendum)
[facility] Pul[facility] Diseases & Critical Care Medicine Progress Note  Patient Name: Todd BlaseWilliam B Wahba MRN: [id number] DOB: 12/15/1945    ADMISSION DATE:  12/15/2017  CHIEF COMPLAINT:  CODE STROKE   ASSESSMENT/PLAN:  ASSESSMENT (included in the Hospital Problem List)  Patient Active Problem List   Diagnosis Date Noted  . Alcohol withdrawal delirium (HCC) 11/28/2017    Priority: High  . COPD with respiratory distress, acute (HCC)     Priority: High  . Acute ischemic left middle cerebral artery (MCA) stroke (HCC) Mar 29, 2018    Priority: High  . Aspiration pneumonia (HCC) 11/29/2017    Priority: Medium  . Hypertension, essential 11/28/2017    Priority: Medium  . DM (diabetes mellitus), type 2 with neurological complications (HCC) 11/28/2017    Priority: Medium  . Abnormal CXR 11/28/2017    Priority: Low  . Bilateral carotid artery stenosis 08/28/2016  . Atherosclerosis of native arteries of extremity with intermittent claudication (HCC) 07/09/2015    PLAN/RECOMMENDATIONS   Furosemide 40 mg IV q 12 hr x 2 doses today. KCl 40 mEq supplement x 1.  Continue Unasyn/vancomycin.  Continue DuoNeb/Pulmicort  Keep HOB elevated 30+ degrees  Airway care  Continue Vasotec IV  Add labetal PRN. Hydralazine 10 mg IV PRN for SBP > 180  On CIWA protocol  Wean Precedex as tolerated. Ativan PRN. Titrate to RASS 0.  On sliding scale insulin.  Check BMP, Mg.   NUTRITION: on Osmolite 1.5 @ 55     SUBJECTIVE:   -Interim events: arousable. Answers questions appropriately. Dysarthric. Off Cardene gtt. Remains extubated. Now on 40% Venti mask. Opens eyes to voice. R facial droop. Continues to have R hemiparesis. R toes and feet flicker. Dense deficit in R UE. No gaze preference. WBC count (8.4) normalized on Unasyn/vancomycin.   HISTORY OF PRESENT ILLNESS:        This is a 72 year old smoker was originally seen in consultation at the request of Dr. Farrel Demarkarla Price for  recommendation on further evaluation and management of CODE STROKE.  He presented with L gaze deviation and R facial droop and received tPA in the ED. CTA suggested a left M2 occlusion, and he was taken to angiography where no occlusion was found following the tPA. He arrived in the ICU hypertensive. He was given labetalol and became bradycardic into the 40's. Sedation was held to see if bradycardia might be secondary to an intracranial catastrophe. He was following instructions but not moving the right side.CT of the head did not show hemorrhage. He has intubated for airway protection. On 1/8, started showing symptoms suspicious for alcohol withdrawal (with family providing history of regular EtOH consumption at baseline).  REVIEW OF SYSTEMS:   Unobtainable   PAST MEDICAL/SURGICAL/SOCIAL/FAMILY HISTORY  Past Medical History:  Diagnosis Date  . 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 Carotid stenosis   . CHF (congestive heart failure) (HCC)   . Depression   . Diabetes mellitus without complication (HCC)   . Hyperlipidemia   . Hypertension   . Obesity   . Peripheral arterial disease (HCC)   . Polyuria     Past Surgical History:  Procedure Laterality Date  . CAROTID ENDARTERECTOMY    . IR ANGIO INTRA EXTRACRAN SEL COM CAROTID INNOMINATE BILAT MOD SED  12/14/2017  . IR ANGIO VERTEBRAL SEL SUBCLAVIAN INNOMINATE UNI R MOD SED  11/23/2017  . IR ANGIO VERTEBRAL SEL VERTEBRAL UNI L MOD SED  11/29/2017  .  RADIOLOGY WITH ANESTHESIA N/A Nov 29, 2017   Procedure: RADIOLOGY WITH ANESTHESIA;  Surgeon: Julieanne Cotton, MD;  Location: [facility] OR;  Service: Radiology;  Laterality: N/A;    Social History   Tobacco Use  . Smoking status: Current Every Day Smoker    Types: Cigarettes  . Smokeless tobacco: Never Used  . Tobacco comment: Up 1 1/2 pks per day  Substance Use Topics  . Alcohol use: Yes    Alcohol/week: 4.2 oz    Types: 7 Glasses of wine per week    Family History   Problem Relation Age of Onset  . Peripheral vascular disease Father     Prior to Admission medications   Medication Sig Start Date End Date Taking? Authorizing Provider  amLODipine (NORVASC) 5 MG tablet Take 5 mg by mouth daily.   Yes [provider]  atorvastatin (LIPITOR) 20 MG tablet Take 20 mg by mouth daily.   Yes [provider]  citalopram (CELEXA) 40 MG tablet Take 40 mg by mouth daily.   Yes [provider]  dicyclomine (BENTYL) 20 MG tablet Take 20 mg by mouth 2 (two) times daily.   Yes [provider]  insulin lispro protamine-lispro (HUMALOG 75/25 MIX) (75-25) 100 UNIT/ML SUSP injection Inject 57 Units into the skin 2 (two) times daily.   Yes [provider]  methocarbamol (ROBAXIN) 500 MG tablet Take 500 mg by mouth every 8 (eight) hours as needed for muscle spasms.   Yes [provider]  oxybutynin (DITROPAN) 5 MG tablet Take 5 mg by mouth 2 (two) times daily.   Yes [provider]  oxyCODONE-acetaminophen (PERCOCET/ROXICET) 5-325 MG tablet Take 1 tablet by mouth every 8 (eight) hours as needed for severe pain.   Yes [provider]  tamsulosin (FLOMAX) 0.4 MG CAPS capsule Take 0.8 mg by mouth daily after supper.    Yes [provider]  traMADol (ULTRAM) 50 MG tablet Take 50-100 mg by mouth every 6 (six) hours as needed for moderate pain.   Yes [provider]  aspirin 81 MG tablet Take 81 mg by mouth daily.    [provider]    No Known Allergies  Current Facility-Administered Medications:  .   stroke: mapping our early stages of recovery book, , Does not apply, Once, Aroor, Georgiana Spinner R, MD .  acetaminophen (TYLENOL) tablet 650 mg, 650 mg, Oral, Q4H PRN **OR** acetaminophen (TYLENOL) solution 650 mg, 650 mg, Per Tube, Q4H PRN **OR** acetaminophen (TYLENOL) suppository 650 mg, 650 mg, Rectal, Q4H PRN, Aroor, Georgiana Spinner R, MD .  albuterol (PROVENTIL) (2.5 MG/3ML) 0.083% nebulizer  solution 2.5 mg, 2.5 mg, Nebulization, Q2H PRN, Marcelle Smiling, MD, 2.5 mg at 11/28/17 0116 .  Ampicillin-Sulbactam (UNASYN) 3 g in sodium chloride 0.9 % 100 mL IVPB, 3 g, Intravenous, Q6H, Rumbarger, Faye Ramsay, RPH, Stopped at 12/01/17 0457 .  aspirin suppository 300 mg, 300 mg, Rectal, Daily, Rejeana Brock, MD, 300 mg at 12/01/17 0916 .  budesonide (PULMICORT) nebulizer solution 0.5 mg, 0.5 mg, Nebulization, BID, Hammonds, Curt Jews, MD, 0.5 mg at 12/01/17 0726 .  chlorhexidine (PERIDEX) 0.12 % solution 15 mL, 15 mL, Mouth Rinse, BID, Micki Riley, MD, 15 mL at 12/01/17 0916 .  dexmedetomidine (PRECEDEX) 200 MCG/50ML (4 mcg/mL) infusion, 0.2-1.4 mcg/kg/hr, Intravenous, Continuous, Hammonds, Curt Jews, MD, Last Rate: 10.2 mL/hr at 12/01/17 1001, 0.4 mcg/kg/hr at 12/01/17 1001 .  enalaprilat (VASOTEC) injection 1.25 mg, 1.25 mg, Intravenous, Q6H, Marcelle Smiling, MD, 1.25 mg at 12/01/17 0528 .  feeding supplement (OSMOLITE 1.5 CAL) liquid 1,000 mL, 1,000 mL, Per Tube, Continuous, Marcelle Smiling, MD, Last Rate: 55 mL/hr at 12/01/17 1000, 1,000 mL at 12/01/17 1000 .  feeding supplement (PRO-STAT SUGAR FREE 64) liquid 30 mL, 30 mL, Per Tube, BID, Marcelle Smiling, MD, 30 mL at 12/01/17 0916 .  folic acid (FOLVITE) tablet 1 mg, 1 mg, Per Tube, Daily, Selmer Dominion B, NP, 1 mg at 12/01/17 0916 .  furosemide (LASIX) injection 40 mg, 40 mg, Intravenous, Q12H, Marcelle Smiling, MD .  hydrALAZINE (APRESOLINE) injection 10 mg, 10 mg, Intravenous, Q4H PRN, Selmer Dominion B, NP, 10 mg at 11/30/17 1819 .  insulin aspart (novoLOG) injection 0-15 Units, 0-15 Units, Subcutaneous, Q4H, Tobey Grim, NP, 5 Units at 12/01/17 0752 .  ipratropium-albuterol (DUONEB) 0.5-2.5 (3) MG/3ML nebulizer solution 3 mL, 3 mL, Nebulization, Q4H, Hammonds, Curt Jews, MD, 3 mL at 12/01/17 0726 .  LORazepam (ATIVAN) injection 1-2 mg, 1-2 mg, Intravenous, Q6H, Marcelle Smiling, MD, 2 mg at 12/01/17 0916 .   LORazepam (ATIVAN) injection 2 mg, 2 mg, Intravenous, Q2H PRN, Selmer Dominion B, NP, 2 mg at 11/30/17 0042 .  MEDLINE mouth rinse, 15 mL, Mouth Rinse, q12n4p, Micki Riley, MD, 15 mL at 11/30/17 1600 .  multivitamin liquid 15 mL, 15 mL, Per Tube, Daily, Selmer Dominion B, NP, 15 mL at 12/01/17 0916 .  nicardipine (CARDENE) 20mg  in 0.86% saline IV infusion (0.1 mg/ml), 3-15 mg/hr, Intravenous, Continuous, Lynnell Jude, MD, Stopped at 11/29/17 0345 .  pantoprazole sodium (PROTONIX) 40 mg/20 mL oral suspension 40 mg, 40 mg, Per Tube, QHS, Micki Riley, MD, 40 mg at 11/30/17 2147 .  potassium chloride 20 MEQ/15ML (10%) solution 40 mEq, 40 mEq, Per Tube, Once, Selmer Dominion B, NP .  potassium chloride 20 MEQ/15ML (10%) solution 40 mEq, 40 mEq, Oral, Daily, Marcelle Smiling, MD .  thiamine (VITAMIN B-1) tablet 100 mg, 100 mg, Per Tube, Daily, Selmer Dominion B, NP, 100 mg at 12/01/17 0916 .  vancomycin (VANCOCIN) IVPB 1000 mg/200 mL premix, 1,000 mg, Intravenous, Q12H, Rumbarger, Faye Ramsay, RPH, Stopped at 12/01/17 0023   OBJECTIVE:   VITAL SIGNS: BP (!) 177/72 (BP Location: Right Arm)   Pulse 62   Temp 97.7 F (36.5 C) (Oral)   Resp (!) 31   Ht 6' (1.829 m)   Wt 100.7 kg (222 lb 0.1 oz)   SpO2 99%   BMI 30.11 kg/m  Vitals:   12/01/17 0830 12/01/17 0900 12/01/17 0930 12/01/17 1000  BP: (!) 159/62 (!) 177/70 (!) 179/95 (!) 177/72  Pulse: 71 69 61 62  Resp: (!) 24 (!) 27 (!) 25 (!) 31  Temp:      TempSrc:      SpO2: 96% 95% 96% 99%  Weight:      Height:        HEMODYNAMICS:    VENTILATOR SETTINGS: FiO2 (%):  [45 %] 45 %  INTAKE / OUTPUT: I/O last 3 completed shifts: In: 1368.7 [I.V.:536.2; NG/GT:632.5; IV Piggyback:200] Out: 1500 [Urine:1500]  PHYSICAL EXAMINATION: General: Somnolent. Unable to assess orientation due to dysphasia/aphasia. Neuro: pupils midline (no longer demonstrating gaze preference). Pupils equal, 2 mm. R Babinksi. DTR 1+ @ RUE, 2+ @ LUE, 1+ @  RLE, 1+ @ LLE. Neck: Right carotid endarterectomy scar.  There is no JVD CHEST: Symmetric in development and expansion. Diminished air entry. B rales and rhonchi. Regular S1 and S2 without murmur, rub or gallop. ABDOMEN: obese, soft and nontender. no organomegaly,  no icterus. EXTREMITIES: no dependent edema. No clubbing. No cyanosis.   LABS:  BMET Recent Labs  Lab 11/29/17 0449 11/30/17 0530 12/01/17 0725  NA 142 142 144  K 3.6 3.2* 3.7  CL 107 107 110  CO2 25 25 23   BUN 18 24* 26*  CREATININE 0.78 0.68 0.63  GLUCOSE 146* 181* 273*    Electrolytes Recent Labs  Lab 11/29/17 0449  11/30/17 0530 11/30/17 1622 12/01/17 0725  CALCIUM 9.0  --  9.0  --  8.5*  MG 2.1   < > 2.0 1.9 2.0  PHOS  --    < > 3.0 2.5 3.2   < > = values in this interval not displayed.    CBC Recent Labs  Lab 11/28/17 2206 11/29/17 1211 12/01/17 0935  WBC 13.0* 10.6* 8.4  HGB 14.4 14.3 13.3  HCT 43.1 42.4 40.6  PLT 247 230 248    Coag's Recent Labs  Lab 12/16/17 1441  APTT 26  INR 1.10    Sepsis Markers Recent Labs  Lab 11/28/17 2206 11/29/17 1211 11/30/17 0530  LATICACIDVEN 1.4  --   --   PROCALCITON  --  0.16 0.11    ABG Recent Labs  Lab 11/28/17 1404 11/28/17 2200 11/29/17 1006  PHART 7.431 7.388 7.405  PCO2ART 32.8 40.3 42.4  PO2ART 78.0* 239.0* 223.0*    Liver Enzymes Recent Labs  Lab 2017-12-16 1441 11/28/17 1645  AST 20 37  ALT 19 40  ALKPHOS 73 76  BILITOT 0.7 1.1  ALBUMIN 3.4* 3.0*    Cardiac Enzymes No results for input(s): TROPONINI, PROBNP in the last 168 hours.  Glucose Recent Labs  Lab 11/30/17 1146 11/30/17 1548 11/30/17 1948 11/30/17 2339 12/01/17 0422 12/01/17 0728  GLUCAP 144* 150* 189* 274* 268* 246*    Imaging Dg Chest Port 1 View  Result Date: 12/01/2017 CLINICAL DATA:  Pulmonary edema. EXAM: PORTABLE CHEST 1 VIEW COMPARISON:  One-view chest x-ray 11/30/2017. FINDINGS: A small bore feeding tube is in place. This courses off  the inferior border the film. The heart size is exaggerated by low lung volumes. A right pleural effusion is stable. Interstitial edema is unchanged. Right greater than left basilar airspace disease is stable. IMPRESSION: 1. Stable right-sided pleural effusion and right greater than left basilar airspace disease, likely atelectasis. Infection is not excluded. 2. Interval placement of small bore feeding tube, coursing off the inferior border of the film. Electronically Signed   By: Marin Roberts M.D.   On: 12/01/2017 09:29    CULTURES: Results for orders placed or performed during the hospital encounter of 12/16/17  MRSA PCR Screening     Status: None   Collection Time: 11/26/17  2:07 AM  Result Value Ref Range Status   MRSA by PCR NEGATIVE NEGATIVE Final    Comment:        The GeneXpert MRSA Assay (FDA approved for NASAL specimens only), is one component of a comprehensive MRSA colonization surveillance program. It is not intended to diagnose MRSA infection nor to guide or monitor treatment for MRSA infections.   Culture, blood (routine x 2)     Status: None (Preliminary result)   Collection Time: 11/29/17 11:16 AM  Result Value Ref Range Status   Specimen Description BLOOD RIGHT ANTECUBITAL  Final   Special Requests IN PEDIATRIC BOTTLE Blood Culture adequate volume  Final   Culture  Setup Time   Final    GRAM POSITIVE COCCI IN PEDIATRIC BOTTLE CRITICAL VALUE NOTED.  VALUE  IS CONSISTENT WITH PREVIOUSLY REPORTED AND CALLED VALUE.    Culture   Final    GRAM POSITIVE COCCI CULTURE REINCUBATED FOR BETTER GROWTH    Report Status PENDING  Incomplete  Culture, blood (routine x 2)     Status: None (Preliminary result)   Collection Time: 11/29/17 12:00 PM  Result Value Ref Range Status   Specimen Description BLOOD RIGHT ANTECUBITAL  Final   Special Requests IN PEDIATRIC BOTTLE Blood Culture adequate volume  Final   Culture  Setup Time   Final    GRAM POSITIVE COCCI IN  CLUSTERS IN PEDIATRIC BOTTLE CRITICAL RESULT CALLED TO, READ BACK BY AND VERIFIED WITH: J. MILLEN PHARMD, AT 1621 11/30/17 BY D. VANHOOK    Culture GRAM POSITIVE COCCI  Final   Report Status PENDING  Incomplete  Blood Culture ID Panel (Reflexed)     Status: Abnormal   Collection Time: 11/29/17 12:00 PM  Result Value Ref Range Status   Enterococcus species NOT DETECTED NOT DETECTED Final   Listeria monocytogenes NOT DETECTED NOT DETECTED Final   Staphylococcus species DETECTED (A) NOT DETECTED Final    Comment: Methicillin (oxacillin) resistant coagulase negative staphylococcus. Possible blood culture contaminant (unless isolated from more than one blood culture draw or clinical case suggests pathogenicity). No antibiotic treatment is indicated for blood  culture contaminants. CRITICAL RESULT CALLED TO, READ BACK BY AND VERIFIED WITH: J. MILLEN PHARMD, AT 1621 11/30/17 BY D. VANHOOK    Staphylococcus aureus NOT DETECTED NOT DETECTED Final   Methicillin resistance DETECTED (A) NOT DETECTED Final    Comment: CRITICAL RESULT CALLED TO, READ BACK BY AND VERIFIED WITH: J. MILLEN PHARMD, AT 1621 11/30/17 BY D. VANHOOK    Streptococcus species NOT DETECTED NOT DETECTED Final   Streptococcus agalactiae NOT DETECTED NOT DETECTED Final   Streptococcus pneumoniae NOT DETECTED NOT DETECTED Final   Streptococcus pyogenes NOT DETECTED NOT DETECTED Final   Acinetobacter baumannii NOT DETECTED NOT DETECTED Final   Enterobacteriaceae species DETECTED (A) NOT DETECTED Final    Comment: Enterobacteriaceae represent a large family of gram negative bacteria, not a single organism. Refer to culture for further identification. CRITICAL RESULT CALLED TO, READ BACK BY AND VERIFIED WITH: J. MILLEN PHARMD, AT 1621 11/30/17 BY D. VANHOOK    Enterobacter cloacae complex NOT DETECTED NOT DETECTED Final   Escherichia coli NOT DETECTED NOT DETECTED Final   Klebsiella oxytoca NOT DETECTED NOT DETECTED Final    Klebsiella pneumoniae NOT DETECTED NOT DETECTED Final   Proteus species NOT DETECTED NOT DETECTED Final   Serratia marcescens NOT DETECTED NOT DETECTED Final   Carbapenem resistance NOT DETECTED NOT DETECTED Final   Haemophilus influenzae NOT DETECTED NOT DETECTED Final   Neisseria meningitidis NOT DETECTED NOT DETECTED Final   Pseudomonas aeruginosa NOT DETECTED NOT DETECTED Final   Candida albicans NOT DETECTED NOT DETECTED Final   Candida glabrata NOT DETECTED NOT DETECTED Final   Candida krusei NOT DETECTED NOT DETECTED Final   Candida parapsilosis NOT DETECTED NOT DETECTED Final   Candida tropicalis NOT DETECTED NOT DETECTED Final    OTHER STUDIES:   ECHO (11/26/2017) - Left ventricle: Inferobasal hypokinesis. Wall thickness was increased in a pattern of mild LVH. Systolic function was normal.  The estimated ejection fraction was in the range of 50% to 55%.Doppler parameters are consistent with both elevated ventricular end-diastolic filling pressure and elevated left atrial filling pressure. - Left atrium: The atrium was moderately dilated. - Atrial septum: Marked hypertrophy of the basal atrial septum.  Likely lipomatous but consider TEE given setting of CVA. No defect or patent foramen ovale was identified.  ANTIBIOTICS: Vancomycin (started 11/30/2017) for pneumonia and MR-CONS bacteremia   FAMILY  - Updates: nephew was updated yesterday evening.  - Inter-disciplinary family meet or Palliative Care meeting due by:     Marcelle Smiling, MD Board Certified by the ABIM, Pulmonary Diseases & Critical Care Medicine  [facility] Pager: [phone]  12/01/2017, 10:39 AM

## 2017-12-02 DIAGNOSIS — Z4659 Encounter for fitting and adjustment of other gastrointestinal appliance and device: Secondary | ICD-10-CM

## 2017-12-02 DIAGNOSIS — J690 Pneumonitis due to inhalation of food and vomit: Secondary | ICD-10-CM

## 2017-12-02 DIAGNOSIS — J9601 Acute respiratory failure with hypoxia: Secondary | ICD-10-CM

## 2017-12-02 DIAGNOSIS — Z515 Encounter for palliative care: Secondary | ICD-10-CM

## 2017-12-02 DIAGNOSIS — I63512 Cerebral infarction due to unspecified occlusion or stenosis of left middle cerebral artery: Principal | ICD-10-CM

## 2017-12-02 DIAGNOSIS — R0602 Shortness of breath: Secondary | ICD-10-CM

## 2017-12-02 LAB — BASIC METABOLIC PANEL
ANION GAP: 12 (ref 5–15)
BUN: 21 mg/dL — ABNORMAL HIGH (ref 6–20)
CO2: 27 mmol/L (ref 22–32)
Calcium: 8.8 mg/dL — ABNORMAL LOW (ref 8.9–10.3)
Chloride: 108 mmol/L (ref 101–111)
Creatinine, Ser: 0.73 mg/dL (ref 0.61–1.24)
GFR calc Af Amer: >60 mL/min (ref >60)
Glucose, Bld: 303 mg/dL — ABNORMAL HIGH (ref 65–99)
POTASSIUM: 3.5 mmol/L (ref 3.5–5.1)
Sodium: 147 mmol/L — ABNORMAL HIGH (ref 135–145)

## 2017-12-02 LAB — BRAIN NATRIURETIC PEPTIDE: B NATRIURETIC PEPTIDE 5: 225.2 pg/mL — AB (ref 0.0–100.0)

## 2017-12-02 LAB — GLUCOSE, CAPILLARY
GLUCOSE-CAPILLARY: 280 mg/dL — AB (ref 65–99)
GLUCOSE-CAPILLARY: 304 mg/dL — AB (ref 65–99)
GLUCOSE-CAPILLARY: 334 mg/dL — AB (ref 65–99)
Glucose-Capillary: 387 mg/dL — ABNORMAL HIGH (ref 65–99)
Glucose-Capillary: 320 mg/dL — ABNORMAL HIGH (ref 65–99)

## 2017-12-02 LAB — MAGNESIUM: Magnesium: 1.8 mg/dL (ref 1.7–2.4)

## 2017-12-02 MED ORDER — ONDANSETRON 4 MG PO TBDP: 4.0000 mg | ORAL_TABLET | Freq: Four times a day (QID) | ORAL | Status: DC | PRN

## 2017-12-02 MED ORDER — ENALAPRIL MALEATE 10 MG PO TABS
10.0000 mg | ORAL_TABLET | Freq: Two times a day (BID) | ORAL | Status: DC
  Administered 2017-12-02: 10 mg
  Filled 2017-12-02: qty 1

## 2017-12-02 MED ORDER — SODIUM CHLORIDE 0.9 % IV SOLN
6.0000 mg/h | INTRAVENOUS | Status: DC
  Administered 2017-12-02: 5 mg/h via INTRAVENOUS
  Filled 2017-12-02: qty 10

## 2017-12-02 MED ORDER — LORAZEPAM 1 MG PO TABS: 1.0000 mg | ORAL_TABLET | ORAL | Status: DC | PRN

## 2017-12-02 MED ORDER — HALOPERIDOL LACTATE 5 MG/ML IJ SOLN: 0.5000 mg | INTRAMUSCULAR | Status: DC | PRN

## 2017-12-02 MED ORDER — LORAZEPAM 2 MG/ML IJ SOLN: 1.0000 mg | INTRAMUSCULAR | Status: DC | PRN

## 2017-12-02 MED ORDER — GLYCOPYRROLATE 0.2 MG/ML IJ SOLN: 0.2000 mg | INTRAMUSCULAR | Status: DC | PRN

## 2017-12-02 MED ORDER — GLYCOPYRROLATE 0.2 MG/ML IJ SOLN
0.4000 mg | Freq: Three times a day (TID) | INTRAMUSCULAR | Status: DC
  Administered 2017-12-02 – 2017-12-03 (×4): 0.4 mg via INTRAVENOUS
  Filled 2017-12-02 (×4): qty 2

## 2017-12-02 MED ORDER — MORPHINE BOLUS VIA INFUSION
2.0000 mg | INTRAVENOUS | Status: DC | PRN
  Administered 2017-12-02: 4 mg via INTRAVENOUS
  Administered 2017-12-02 – 2017-12-03 (×3): 2 mg via INTRAVENOUS
  Filled 2017-12-02: qty 4

## 2017-12-02 MED ORDER — POTASSIUM CHLORIDE 20 MEQ/15ML (10%) PO SOLN
40.0000 meq | Freq: Once | ORAL | Status: AC
  Administered 2017-12-02: 40 meq via ORAL
  Filled 2017-12-02: qty 30

## 2017-12-02 MED ORDER — GLYCOPYRROLATE 1 MG PO TABS
1.0000 mg | ORAL_TABLET | ORAL | Status: DC | PRN
  Filled 2017-12-02: qty 1

## 2017-12-02 MED ORDER — GLYCOPYRROLATE 0.2 MG/ML IJ SOLN
0.2000 mg | INTRAMUSCULAR | Status: DC | PRN
  Administered 2017-12-03: 0.2 mg via INTRAVENOUS

## 2017-12-02 MED ORDER — ATORVASTATIN CALCIUM 20 MG PO TABS: 20.0000 mg | ORAL_TABLET | Freq: Every day | ORAL | Status: DC

## 2017-12-02 MED ORDER — LORAZEPAM 2 MG/ML PO CONC: 1.0000 mg | ORAL | Status: DC | PRN

## 2017-12-02 MED ORDER — POLYVINYL ALCOHOL 1.4 % OP SOLN
1.0000 [drp] | Freq: Four times a day (QID) | OPHTHALMIC | Status: DC | PRN
  Filled 2017-12-02: qty 15

## 2017-12-02 MED ORDER — ASPIRIN 325 MG PO TABS
325.0000 mg | ORAL_TABLET | Freq: Every day | ORAL | Status: DC
  Administered 2017-12-02: 325 mg
  Filled 2017-12-02: qty 1

## 2017-12-02 MED ORDER — ACETAMINOPHEN 325 MG PO TABS: 650.0000 mg | ORAL_TABLET | Freq: Four times a day (QID) | ORAL | Status: DC | PRN

## 2017-12-02 MED ORDER — HALOPERIDOL 1 MG PO TABS
0.5000 mg | ORAL_TABLET | ORAL | Status: DC | PRN
  Filled 2017-12-02: qty 1

## 2017-12-02 MED ORDER — ONDANSETRON HCL 4 MG/2ML IJ SOLN: 4.0000 mg | Freq: Four times a day (QID) | INTRAMUSCULAR | Status: DC | PRN

## 2017-12-02 MED ORDER — CITALOPRAM HYDROBROMIDE 10 MG PO TABS: 40.0000 mg | ORAL_TABLET | Freq: Every day | ORAL | Status: DC

## 2017-12-02 MED ORDER — INSULIN GLARGINE 100 UNIT/ML ~~LOC~~ SOLN
30.0000 [IU] | Freq: Every day | SUBCUTANEOUS | Status: DC
  Administered 2017-12-02: 30 [IU] via SUBCUTANEOUS
  Filled 2017-12-02: qty 0.3

## 2017-12-02 MED ORDER — BIOTENE DRY MOUTH MT LIQD: 15.0000 mL | OROMUCOSAL | Status: DC | PRN

## 2017-12-02 MED ORDER — HALOPERIDOL LACTATE 2 MG/ML PO CONC
0.5000 mg | ORAL | Status: DC | PRN
  Filled 2017-12-02: qty 0.3

## 2017-12-02 MED ORDER — AMLODIPINE BESYLATE 10 MG PO TABS: 10.0000 mg | ORAL_TABLET | Freq: Every day | ORAL | Status: DC

## 2017-12-02 MED ORDER — ACETAMINOPHEN 650 MG RE SUPP: 650.0000 mg | Freq: Four times a day (QID) | RECTAL | Status: DC | PRN

## 2017-12-02 MED ORDER — ENALAPRIL MALEATE 20 MG PO TABS
20.0000 mg | ORAL_TABLET | Freq: Two times a day (BID) | ORAL | Status: DC
  Filled 2017-12-02: qty 1

## 2017-12-02 NOTE — Progress Notes (Addendum)
CSW spoke with patient's HCPOA, Trip (nephew) to discuss starting the SNF referral process. CSW explained to Trip what this process involved and how it would be presented to him after bed offers were made from facilities. Trip expressed understanding and informed CSW to please focus [place] proximity to family members. CSW answered general questions for Trip. Patient faxed out to local facilities, awaiting bed offers.  CSW to continue following until discharge is complete.  Edwin Dadaarol Khalfani Weideman, MSW, LCSW-A Weekend Clinical Social Worker [phone]

## 2017-12-02 NOTE — Progress Notes (Signed)
PCCM Interval Note   Called patient bedside to evaluate respiratory status. Patient with tachypnea, oxygenating well on aerosol mask, given lasix at 2200. Currently on Precedex infusion.   Todd [name], HCPOA, states that he has spoke in length with both critical care and neurology this week and feels like the best course of action would be not to re-intubate or perform CPR if patient were to arrest. Code status updated in EMR.   CXR, ABG, BNP ordered.   CXR with slight decrease in size of right pleural effusion with continued cardiomegaly with mild vascular congestion and interstitial edema. > has received 40 meq of lasix and currently is negative 2.8L for last 24 hours with no wheeze/crackles at this time. Will use BiPAP if needed.   Todd [name], AGACNP-BC [facility]  Pgr: [phone]  PCCM Pgr: [phone]

## 2017-12-02 NOTE — [place] FL2 (Signed)
[place] MEDICAID FL2 LEVEL OF CARE SCREENING TOOL     IDENTIFICATION  Patient Name: Todd [name] Birthdate: [birth year] Sex: male Admission Date (Current Location): 12/20/2017  [facility] and [place] Number:  Producer, television/film/video and Address:  [facility], [address], [place], [place] [address]      Provider Number: [id number]  Attending Physician Name and Address:  Marvel Plan, MD  Relative Name and Phone Number:  Tomasa Hose [phone]    Current Level of Care: Hospital Recommended Level of Care: Skilled Nursing Facility Prior Approval Number:    Date Approved/Denied:   PASRR Number:  [id number]   Discharge Plan: SNF    Current Diagnoses: Patient Active Problem List   Diagnosis Date Noted  . Acute embolic stroke (HCC)   . Wheezing   . Cerebrovascular accident (CVA) due to embolism of cerebral artery (HCC)   . Aspiration pneumonia (HCC) 11/29/2017  . Abnormal CXR 11/28/2017  . Hypertension, essential 11/28/2017  . DM (diabetes mellitus), type 2 with neurological complications (HCC) 11/28/2017  . Alcohol withdrawal delirium (HCC) 11/28/2017  . COPD with respiratory distress, acute (HCC)   . Acute ischemic left middle cerebral artery (MCA) stroke (HCC) 12/01/2017  . Bilateral carotid artery stenosis 08/28/2016  . Atherosclerosis of native arteries of extremity with intermittent claudication (HCC) 07/09/2015    Orientation RESPIRATION BLADDER Height & Weight     Self  O2 Incontinent Weight: 222 lb 0.1 oz (100.7 kg) Height:  6' (182.9 cm)  BEHAVIORAL SYMPTOMS/MOOD NEUROLOGICAL BOWEL NUTRITION STATUS      Incontinent Feeding tube  AMBULATORY STATUS COMMUNICATION OF NEEDS Skin   Extensive Assist Non-Verbally(Patient agitated and anxious) Normal                       Personal Care Assistance Level of Assistance  Bathing, Feeding, Total care, Dressing Bathing Assistance: Maximum assistance Feeding assistance: Maximum  assistance Dressing Assistance: Maximum assistance Total Care Assistance: Maximum assistance   Functional Limitations Info  Speech, Hearing   Hearing Info: Impaired Speech Info: Impaired    SPECIAL CARE FACTORS FREQUENCY  OT (By licensed OT), PT (By licensed PT)     PT Frequency: 5x OT Frequency: 5x            Contractures      Additional Factors Info  Code Status Code Status Info: DNR             Current Medications (12/02/2017):  This is the current hospital active medication list Current Facility-Administered Medications  Medication Dose Route Frequency Provider Last Rate Last Dose  .  stroke: mapping our early stages of recovery book   Does not apply Once Aroor, Georgiana Spinner R, MD      . acetaminophen (TYLENOL) tablet 650 mg  650 mg Oral Q4H PRN Aroor, Dara Lords, MD       Or  . acetaminophen (TYLENOL) solution 650 mg  650 mg Per Tube Q4H PRN Aroor, Dara Lords, MD   650 mg at 12/02/17 1025   Or  . acetaminophen (TYLENOL) suppository 650 mg  650 mg Rectal Q4H PRN Aroor, Georgiana Spinner R, MD      . albuterol (PROVENTIL) (2.5 MG/3ML) 0.083% nebulizer solution 2.5 mg  2.5 mg Nebulization Q2H PRN Marcelle Smiling, MD   2.5 mg at 11/28/17 0116  . amLODipine (NORVASC) tablet 10 mg  10 mg Per Tube Daily Marvel Plan, MD      . Ampicillin-Sulbactam (UNASYN) 3 g  in sodium chloride 0.9 % 100 mL IVPB  3 g Intravenous Q6H Rumbarger, Faye RamsayRachel L, RPH   Stopped at 12/02/17 0544  . aspirin tablet 325 mg  325 mg Per Tube Daily Marvel PlanXu, Jindong, MD   325 mg at 12/02/17 1025  . [START ON 2018/05/27] atorvastatin (LIPITOR) tablet 20 mg  20 mg Per Tube Daily Marvel PlanXu, Jindong, MD      . budesonide (PULMICORT) nebulizer solution 0.5 mg  0.5 mg Nebulization BID Hammonds, Curt JewsKathleen H, MD   0.5 mg at 12/02/17 0718  . chlorhexidine (PERIDEX) 0.12 % solution 15 mL  15 mL Mouth Rinse BID Micki RileySethi, Pramod S, MD   15 mL at 12/01/17 2150  . [START ON 2018/05/27] citalopram (CELEXA) tablet 40 mg  40 mg Per Tube Daily Marvel PlanXu,  Jindong, MD      . dexmedetomidine (PRECEDEX) 200 MCG/50ML (4 mcg/mL) infusion  0.2-1.4 mcg/kg/hr Intravenous Continuous Hammonds, Curt JewsKathleen H, MD 10.2 mL/hr at 12/02/17 1029 0.4 mcg/kg/hr at 12/02/17 1029  . enalapril (VASOTEC) tablet 10 mg  10 mg Per Tube BID Marvel PlanXu, Jindong, MD   10 mg at 12/02/17 1025  . feeding supplement (OSMOLITE 1.5 CAL) liquid 1,000 mL  1,000 mL Per Tube Continuous Marcelle SmilingJeong, Seong-Joo, MD 55 mL/hr at 12/01/17 1800 1,000 mL at 12/01/17 1800  . feeding supplement (PRO-STAT SUGAR FREE 64) liquid 30 mL  30 mL Per Tube BID Marcelle SmilingJeong, Seong-Joo, MD   30 mL at 12/02/17 0941  . folic acid (FOLVITE) tablet 1 mg  1 mg Per Tube Daily Selmer DominionSimpson, Paula B, NP   1 mg at 12/02/17 0941  . hydrALAZINE (APRESOLINE) injection 10 mg  10 mg Intravenous Q4H PRN Marcelle SmilingJeong, Seong-Joo, MD   10 mg at 12/02/17 1025  . insulin aspart (novoLOG) injection 0-15 Units  0-15 Units Subcutaneous Q4H Tobey GrimEubanks, Katalina M, NP   11 Units at 12/02/17 0759  . ipratropium-albuterol (DUONEB) 0.5-2.5 (3) MG/3ML nebulizer solution 3 mL  3 mL Nebulization TID Hammonds, Curt JewsKathleen H, MD   3 mL at 12/02/17 0718  . labetalol (NORMODYNE,TRANDATE) injection 10 mg  10 mg Intravenous Q2H PRN Marcelle SmilingJeong, Seong-Joo, MD   10 mg at 12/01/17 2223  . LORazepam (ATIVAN) injection 1-2 mg  1-2 mg Intravenous Q6H Marcelle SmilingJeong, Seong-Joo, MD   2 mg at 12/02/17 0940  . LORazepam (ATIVAN) injection 2 mg  2 mg Intravenous Q2H PRN Selmer DominionSimpson, Paula B, NP   2 mg at 11/30/17 0042  . MEDLINE mouth rinse  15 mL Mouth Rinse q12n4p Micki RileySethi, Pramod S, MD   15 mL at 12/01/17 1533  . multivitamin liquid 15 mL  15 mL Per Tube Daily Selmer DominionSimpson, Paula B, NP   15 mL at 12/02/17 0941  . nicardipine (CARDENE) 20mg  in 0.86% saline 200ml IV infusion (0.1 mg/ml)  3-15 mg/hr Intravenous Continuous Lynnell JudeGray, Walter J, MD   Stopped at 11/29/17 0345  . pantoprazole sodium (PROTONIX) 40 mg/20 mL oral suspension 40 mg  40 mg Per Tube QHS Micki RileySethi, Pramod S, MD   40 mg at 12/01/17 2140  . thiamine (VITAMIN B-1)  tablet 100 mg  100 mg Per Tube Daily Selmer DominionSimpson, Paula B, NP   100 mg at 12/02/17 0941  . vancomycin (VANCOCIN) IVPB 1000 mg/200 mL premix  1,000 mg Intravenous Q12H Rumbarger, Faye RamsayRachel L, [facility] CenterRPH   Stopped at 12/02/17 0050     Discharge Medications: Please see discharge summary for a list of discharge medications.  Relevant Imaging Results:  Relevant Lab Results:   Additional Information  SSN: [id number]  Inis Sizer, LCSWA Weekend Clinical Social Worker [phone]

## 2017-12-02 NOTE — Clinical Social Work Note (Signed)
Clinical Social Work Assessment  Patient Details  Name: Todd [name] MRN: [id number] Date of Birth: 11/08/1946  Date of referral:  12/02/17               Reason for consult:  Discharge Planning                Permission sought to share information with:    Permission granted to share information::  Yes, Verbal Permission Granted  Name::     Todd [name]  Agency::     Relationship::   Nephew/HCPOA  Contact Information:  [phone]  Housing/Transportation Living arrangements for the past 2 months:  Single Family Home Source of Information:  Other (Comment Required)(HCPOA- Todd [name] Immunologist(Nephew)) Patient Interpreter Needed:    Criminal Activity/Legal Involvement Pertinent to Current Situation/Hospitalization:  No - Comment as needed Significant Relationships:  Other Family Members Lives with:  Self Do you feel safe going back to the place where you live?  Yes Need for family participation in patient care:  No (Coment)  Care giving concerns:  [facility], will need SNF for rehab services.   Social Worker assessment / plan:  CSW spoke with patient's nephew Todd to discuss SNF placement at discharge. Todd gave CSW verbal permission to begin process and [place]  Employment status:  Retired Network engineernsurance information:  Managed Care PT Recommendations:  Skilled Nursing Facility Information / Referral to community resources:  Skilled Nursing Facility  Patient/Family's Response to care: Patient's nephew and HCPOA understanding and in agreement with plan.  Patient/Family's Understanding of and Emotional Response to Diagnosis, Current Treatment, and Prognosis:  N/A.  Emotional Assessment Appearance:  Appears stated age Attitude/Demeanor/Rapport:    Affect (typically observed):    Orientation:    Alcohol / Substance use:  Not Applicable Psych involvement (Current and /or in the community):  No (Comment)  Discharge Needs   Concerns to be addressed:  Discharge Planning Concerns Readmission within the last 30 days:    Current discharge risk:    Barriers to Discharge:  No Barriers Identified   Inis SizerCarol L Sharilynn Cassity, LCSW 12/02/2017, 10:19 AM

## 2017-12-02 NOTE — Progress Notes (Signed)
NEUROHOSPITALISTS STROKE TEAM - DAILY PROGRESS NOTE   SUBJECTIVE (INTERVAL HISTORY) No family member is at the bedside. He continues to have SOB but ABG has been unremarkable. Pt lethargic but awake alert and following simple commands. Still has right hemiplegia. Still on precedex, BP on the high side.  No significant change from yesterday.    OBJECTIVE Lab Results: CBC:  Recent Labs  Lab 11/28/17 2206 11/29/17 1211 12/01/17 0935  WBC 13.0* 10.6* 8.4  HGB 14.4 14.3 13.3  HCT 43.1 42.4 40.6  MCV 93.1 93.8 94.2  PLT 247 230 248   BMP: Recent Labs  Lab 11/28/17 1645 11/28/17 2206 11/29/17 0449 11/29/17 1655 11/30/17 0530 11/30/17 1622 12/01/17 0725 12/02/17 0009  NA 139  --  142  --  142  --  144 147*  K 3.3*  --  3.6  --  3.2*  --  3.7 3.5  CL 108  --  107  --  107  --  110 108  CO2 22  --  25  --  25  --  23 27  GLUCOSE 159*  --  146*  --  181*  --  273* 303*  BUN 17  --  18  --  24*  --  26* 21*  CREATININE 0.68  --  0.78  --  0.68  --  0.63 0.73  CALCIUM 9.1  --  9.0  --  9.0  --  8.5* 8.8*  MG  --  1.6* 2.1 1.8 2.0 1.9 2.0 1.8  PHOS  --  2.2*  --  2.0* 3.0 2.5 3.2  --    Liver Function Tests:  Recent Labs  Lab 11/28/17 1645  AST 37  ALT 40  ALKPHOS 76  BILITOT 1.1  PROT 6.6  ALBUMIN 3.0*   Thyroid Function Studies:  No results for input(s): TSH, T4TOTAL, T3FREE, THYROIDAB in the last 72 hours. Coagulation Studies:  No results for input(s): APTT, INR in the last 72 hours. PHYSICAL EXAM Temp:  [98.2 F (36.8 C)-99 F (37.2 C)] 98.8 F (37.1 C) (01/13 1112) Pulse Rate:  [60-89] 76 (01/13 1500) Resp:  [19-43] 29 (01/13 1500) BP: (136-198)/(57-96) 197/83 (01/13 1500) SpO2:  [89 %-100 %] 89 % (01/13 1500) FiO2 (%):  [35 %-40 %] 35 % (01/13 1400) General - Well nourished, well developed elderly Caucasian male, in no apparent distress,  HEENT-  Normocephalic, Normal external eye/conjunctiva.  Normal  external ears. Normal external nose, mucus membranes and septum.   Cardiovascular - Regular rate and rhythm  Respiratory - Lungs clear bilaterally. No wheezing. Abdomen - soft and non-tender, BS normal Extremities- no edema or cyanosis Neurological Examination Mental Status: sleepy but can be aroused to open eyes. Nonverbal. Follows only very simple commands. Dysarthric Cranial Nerves: II: Visual fields : Chronic right eye blindness , left homonymous hemianopsia  III,IV, VI: ptosis not present, forced deviation to left side. pupils equal, round, reactive to light and accommodation V,VII RIGHT FACIAL DROOP  VIII: hearing normal bilaterally IX,X: uvula rises symmetrically XI: bilateral shoulder shrug XII: midline tongue extension Motor: Right :  Upper extremity   0/5                                      Left:     Upper extremity   5/5             Lower extremity  0/5                                                  Lower extremity   5/5 Tone and bulk:normal tone throughout; no atrophy noted Sensory: Reduced sensation on right side , +neglect  Plantars: Right: downgoing                                Left: downgoing Gait:Not tested  IMAGING: I have personally reviewed the radiological images below and agree with the radiology interpretations. Ct Angio Head and Neck W Or Wo Contrast with Perfusion Result Date: 12/16/2017 IMPRESSION: 1. Proximal left M2 superior division occlusion with partial distal reconstitution. 2. Extensive ICA siphon atherosclerosis with moderate to severe stenoses bilaterally. 3. 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 Occlusion of the nondominant left vertebral artery distal to PICA. Possible severe right V4 stenoses, not well evaluated. 4. Moderate to severe proximal left PCA stenoses. 5. Patent cervical carotid arteries without significant stenosis. Prior right carotid endarterectomy. 6. Aortic Atherosclerosis (ICD10-I70.0) and  Emphysema (ICD10-J43.9). 7. Moderate-sized, loculated right pleural effusion with pleural thickening suggesting chronicity. 8. 11 mm part solid pulmonary nodule in the left upper lobe.  Ct Head Wo Contrast Result Date: 16-Dec-2017 IMPRESSION: 1. No intracranial hemorrhage. 2. Unchanged appearance of the brain. No evidence of new or enlarging infarct.   Mr Brain Wo Contrast Result Date: 11/26/2017 IMPRESSION: 1. Acute/early subacute infarction involving left posterior limb of internal capsule extending into left anterior parahippocampal gyrus and hippocampus. Additional punctate infarct within left inferior cerebellar hemisphere. No hemorrhage or mass effect. 2. Background of mild chronic microvascular ischemic changes and mild parenchymal volume loss of the brain. 3. 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 4. Mild paranasal sinus disease.   Dg Chest Port 1 View Result Date: 12/16/2017 IMPRESSION: Right pleural effusion and lung opacity, consistent with atelectasis or consolidation.  Ct Head Code Stroke Wo Contrast Result Date: 2017/12/16 IMPRESSION: 1. Atrophy and small vessel disease. Cannot exclude early LEFT posterior limb internal capsule infarct. Chronic insult LEFT cerebellum. 2. ASPECTS is 9.   Echocardiogram:                                              Left ventricle: Inferobasal hypokinesis. Wall thickness was   increased in a pattern of mild LVH. Systolic function was normal.   The estimated ejection fraction was in the range of 50% to 55%.   Doppler parameters are consistent with both elevated ventricular   end-diastolic filling pressure and elevated left atrial filling   pressure.     IMPRESSION: [name] is a 72 y.o. male with PMH of carotid stenosis, CHF, diabetes, hyperlipidemia, hypertension, obesity, peripheral arterial disease on aspirin presents with acute onset of right sided weakness, which progressed to result in hemiplegia.  CT head was  negative for hemorrhage and patient received IV tPA. CT perfusion showed no perfusion deficit, however CT angiogram showed a M2 occlusion. Given patient's right-sided deficits, gaze deviation patient was deemed a candidate for thrombectomy. MRI reveals: Acute/early subacute infarction Left posterior limb of internal capsule extending into left anterior parahippocampal  gyrus and hippocampus Infarct within Left inferior cerebellar hemisphere.  Treated with IV TPA  now persistent agitation and confusion likely due to superimposed alcohol withdrawal STROKE:  Suspected Etiology: Left M2 occlusion and  possibly complication of catheter angiogram  Resultant Symptoms: right-sided deficits, gaze deviation  Stroke Risk Factors: diabetes mellitus, hyperlipidemia and hypertension Other Stroke Risk Factors: Advanced age, Cigarette smoker, ETOH use, Obesity, Body mass index is 30.11 kg/m. , Hx stroke, OSA/likely undx, CHF, PAD  PROCEDURES: 12-25-17  Dr Corliss Skains S/P 4 vessel cerebral arteriogram Rt CFA approach. Findings. 1.No occlusions,stenosis or dissections extracranially or intracranially. Occluded Rt common femoral  Below the inguinal ligament  With distal recostitution of the profuda from prominent collaterals and  brisk run off.    12/02/2017 ASSESSMENT:   Patient remains  agitated and sedated.  Following only minimal commands.  Nonverbal.  No movement of right side.  CCM following.  Appreciate assistance.     We will continue to follow closely.  PLAN  12/02/2017:  Continue sedation as per pulmonary critical care M.D. for suspected alcohol withdrawal Pass panda tube and start tube feeds Frequent neuro checks Telemetry monitoring PT/OT/SLP Ongoing aggressive stroke risk factor management Patient will be counseled to be compliant with his antithrombotic medications Patient will be counseled on Lifestyle modifications including, Diet, Exercise, and Stress Follow up with [facility] in 6 weeks  HX OF STROKES: Small chronic infarct within left middle cerebellar peduncle  INTRACRANIAL Atherosclerosis &Stenosis: May consider DAPT prior to discharge  DYSPHAGIA: NPO until passes SLP swallow evaluation Aspiration Precautions in progress  Medical issues per CCM team Agitation due to presumed alcohol withdrawal Hypotension Hypokalemia Hypocalcemia  Pulmonary nodule 11 mm part solid pulmonary nodule in the left upper lobe Outpatient follow-up CT recommended at 3-6 months to confirm persistence If unchanged, and solid component remains <6 mm, annual CT until 5 years of stability  If persistent these nodules should be considered highly suspicious if the solid component of the nodule is 6 mm or greater in size and enlarging.   HYPERTENSION: Stable SBP goal of < 180. DBP goal of < 105.  Nicardipine drip, Labetolol PRN Long term BP goal normotensive. May slowly start B/P medications after 48 hours, if applicable Home Meds: NONE  HYPERLIPIDEMIA:    Component Value Date/Time   CHOL 168 11/26/2017 0524   TRIG 96 11/26/2017 0524   HDL 44 11/26/2017 0524   CHOLHDL 3.8 11/26/2017 0524   VLDL 19 11/26/2017 0524   LDLCALC 105 (H) 11/26/2017 0524  Home Meds:  NONE LDL  goal < 70 Will started on  Lipitor to 40 mg daily, once patient passes SLP evaluation Continue statin at discharge  DIABETES: Lab Results  Component Value Date   HGBA1C 7.6 (H) 11/26/2017  HgbA1c goal < 7.0 Currently on: NovoLog Continue CBG monitoring and SSI to maintain glucose 140-180 mg/dl DM education   TOBACCO ABUSE and Likely ETOH Abuse Current smoker Smoking cessation counseling provided Nicotine patch provided CIWA, as needed  OBESITY Obesity, Body mass index is 30.11 kg/m. Greater than/equal to 30  Other Active Problems: Principal Problem:   Acute ischemic left middle cerebral artery (MCA) stroke (HCC) Active Problems:   COPD with respiratory distress, acute (HCC)    Abnormal CXR   Hypertension, essential   DM (diabetes mellitus), type 2 with neurological complications (HCC)   Alcohol withdrawal delirium (HCC)   Aspiration pneumonia (HCC)   Acute embolic stroke (HCC)   Wheezing   Cerebrovascular accident (  CVA) due to embolism of cerebral artery [facility])    Hospital day # 7 VTE prophylaxis: SCD's  Diet : Diet NPO time specified   FAMILY UPDATES: family at bedside  TEAM UPDATES: Marvel Plan, MD STATUS:  FULL   Prior Home Stroke Medications:  aspirin 81 mg daily  Discharge Stroke Meds:  Please discharge patient on TBD   Disposition:  Therapy Recs:              SNF Home Equipment:         PENDING Follow Up:  Follow-up Information    Micki Riley, MD. Schedule an appointment as soon as possible for a visit in 6 week(s).   Specialties:  Neurology, Radiology Contact information: [address] [place] [address] [phone]          Alysia Penna, MD -PCP Follow up in 1-2 weeks       ATTENDING NOTE:  I have personally examined this patient, reviewed notes, independently viewed imaging studies, participated in medical decision making and plan of care.ROS completed by me personally and pertinent positives fully documented  I have made any additions or clarifications directly to the above note.     72 year old male with history of R carotid stenosis s/p CEA, CHF, DM, HLD, HTN, PAD admitted for right-sided weakness, slurred speech, left gaze.  Status post TPA.  CT head and neck negative but questionable left M2 stenosis.  INR attempted but no vascular stenosis seen.  MRI showed left PICA/thalamus, left medial temporal lobe, left punctate cerebellum infarct.  EF 50-55%.  A1c 7.6 and LDL 105.  His symptoms initially stabilized, however later, developed agitation, confusion, difficulty breathing, concerning for alcohol withdrawal.  He was put on Precedex, with benzoyl as needed. SOB has not been improved. CXR stable. Consider TEE  and loop once stable if no stroke cause found.   Pt now DNR/DNI. Had palliative care consult. Continue CCM care, aspirin and BP control. On amlodipine, enalapril and lasix. Increase enalapril dose. CBG high, continue SSI, but add lantus 30U Qhs. Still on unasyn and vanco for aspiration pneumonia.     Marvel Plan, MD PhD Stroke Neurology 12/02/2017 3:14 PM   This patient is critically ill and at significant risk of neurological worsening, death and care requires constant monitoring of vital signs, hemodynamics,respiratory and cardiac monitoring, extensive review of multiple databases, frequent neurological assessment, discussion with family, other specialists and medical decision making of high complexity. Discussed with Dr. Ardeth Perfect. I spent 40 minutes of neurocritical care time  in the care of  this patient.    To contact Stroke Continuity provider, please refer to [email or web address]. After hours, contact General Neurology

## 2017-12-02 NOTE — Progress Notes (Signed)
[facility] Pulmonary Diseases & Critical Care Medicine Progress Note  Patient Name: Todd [name] MRN: [id number] DOB: [birth year]    ADMISSION DATE:  11/27/2017  CHIEF COMPLAINT:  CODE STROKE   ASSESSMENT/PLAN:  ASSESSMENT (included in the Hospital Problem List)  Patient Active Problem List   Diagnosis Date Noted  . Alcohol withdrawal delirium (HCC) 11/28/2017    Priority: High  . COPD with respiratory distress, acute (HCC)     Priority: High  . Acute ischemic left middle cerebral artery (MCA) stroke (HCC) 11/28/2017    Priority: High  . Aspiration pneumonia (HCC) 11/29/2017    Priority: Medium  . Hypertension, essential 11/28/2017    Priority: Medium  . DM (diabetes mellitus), type 2 with neurological complications (HCC) 11/28/2017    Priority: Medium  . Abnormal CXR 11/28/2017    Priority: Low  . Acute embolic stroke (HCC)   . Wheezing   . Cerebrovascular accident (CVA) due to embolism of cerebral artery (HCC)   . Bilateral carotid artery stenosis 08/28/2016  . Atherosclerosis of native arteries of extremity with intermittent claudication (HCC) 07/09/2015    PLAN/RECOMMENDATIONS   Replete potassium  Continue Unasyn/vancomycin.  Continue DuoNeb/Pulmicort  Keep HOB elevated 30+ degrees  Airway care  Continue Vasotec IV. Add labetal PRN. Hydralazine 10 mg IV PRN for SBP > 180  On CIWA protocol.  On sliding scale insulin.  Check BMP, Mg.   NUTRITION: on Osmolite 1.5 @ 55     SUBJECTIVE:   -Interim events: arousable. Answers some questions. Dysarthric. Off Cardene gtt. Remains extubated. Still on 40% Venti mask. Opens eyes to voice. R facial droop. Continues to have R hemiparesis. R toes and feet flicker. Dense deficit in R UE. No gaze preference. On Unasyn/vancomycin with coagulase negative Staphylococci and Enterobacteriaceae isolated from blood culture.   HISTORY OF PRESENT ILLNESS:        This is a 72 year old smoker was originally seen in  consultation at the request of Dr. Farrel Demark for recommendation on further evaluation and management of CODE STROKE.  He presented with L gaze deviation and R facial droop and received tPA in the ED. CTA suggested a left M2 occlusion, and he was taken to angiography where no occlusion was found following the tPA. He arrived in the ICU hypertensive. He was given labetalol and became bradycardic into the 40's. Sedation was held to see if bradycardia might be secondary to an intracranial catastrophe. He was following instructions but not moving the right side.CT of the head did not show hemorrhage. He has intubated for airway protection. On 1/8, started showing symptoms suspicious for alcohol withdrawal (with family providing history of regular EtOH consumption at baseline).  REVIEW OF SYSTEMS:   Unobtainable   PAST MEDICAL/SURGICAL/SOCIAL/FAMILY HISTORY  Past Medical History:  Diagnosis Date  . 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 Carotid stenosis   . CHF (congestive heart failure) (HCC)   . Depression   . Diabetes mellitus without complication (HCC)   . Hyperlipidemia   . Hypertension   . Obesity   . Peripheral arterial disease (HCC)   . Polyuria     Past Surgical History:  Procedure Laterality Date  . CAROTID ENDARTERECTOMY    . IR ANGIO INTRA EXTRACRAN SEL COM CAROTID INNOMINATE BILAT MOD SED  11/27/2017  . IR ANGIO VERTEBRAL SEL SUBCLAVIAN INNOMINATE UNI R MOD SED  12/16/2017  . IR ANGIO VERTEBRAL SEL VERTEBRAL UNI L MOD SED  11/26/2017  . RADIOLOGY WITH ANESTHESIA N/A December 03, 2017   Procedure: RADIOLOGY WITH ANESTHESIA;  Surgeon: Julieanne Cotton, MD;  Location: [facility] OR;  Service: Radiology;  Laterality: N/A;    Social History   Tobacco Use  . Smoking status: Current Every Day Smoker    Types: Cigarettes  . Smokeless tobacco: Never Used  . Tobacco comment: Up 1 1/2 pks per day  Substance Use Topics  . Alcohol use: Yes    Alcohol/week: 4.2 oz     Types: 7 Glasses of wine per week    Family History  Problem Relation Age of Onset  . Peripheral vascular disease Father     Prior to Admission medications   Medication Sig Start Date End Date Taking? Authorizing Provider  amLODipine (NORVASC) 5 MG tablet Take 5 mg by mouth daily.   Yes [provider]  atorvastatin (LIPITOR) 20 MG tablet Take 20 mg by mouth daily.   Yes [provider]  citalopram (CELEXA) 40 MG tablet Take 40 mg by mouth daily.   Yes [provider]  dicyclomine (BENTYL) 20 MG tablet Take 20 mg by mouth 2 (two) times daily.   Yes [provider]  insulin lispro protamine-lispro (HUMALOG 75/25 MIX) (75-25) 100 UNIT/ML SUSP injection Inject 57 Units into the skin 2 (two) times daily.   Yes [provider]  methocarbamol (ROBAXIN) 500 MG tablet Take 500 mg by mouth every 8 (eight) hours as needed for muscle spasms.   Yes [provider]  oxybutynin (DITROPAN) 5 MG tablet Take 5 mg by mouth 2 (two) times daily.   Yes [provider]  oxyCODONE-acetaminophen (PERCOCET/ROXICET) 5-325 MG tablet Take 1 tablet by mouth every 8 (eight) hours as needed for severe pain.   Yes [provider]  tamsulosin (FLOMAX) 0.4 MG CAPS capsule Take 0.8 mg by mouth daily after supper.    Yes [provider]  traMADol (ULTRAM) 50 MG tablet Take 50-100 mg by mouth every 6 (six) hours as needed for moderate pain.   Yes [provider]  aspirin 81 MG tablet Take 81 mg by mouth daily.    [provider]    No Known Allergies  Current Facility-Administered Medications:  .   stroke: mapping our early stages of recovery book, , Does not apply, Once, Aroor, Georgiana Spinner R, MD .  acetaminophen (TYLENOL) tablet 650 mg, 650 mg, Oral, Q4H PRN **OR** acetaminophen (TYLENOL) solution 650 mg, 650 mg, Per Tube, Q4H PRN, 650 mg at 12/02/17 1025 **OR** acetaminophen (TYLENOL) suppository 650 mg, 650 mg, Rectal, Q4H PRN,  Aroor, Georgiana Spinner R, MD .  albuterol (PROVENTIL) (2.5 MG/3ML) 0.083% nebulizer solution 2.5 mg, 2.5 mg, Nebulization, Q2H PRN, Marcelle Smiling, MD, 2.5 mg at 11/28/17 0116 .  amLODipine (NORVASC) tablet 10 mg, 10 mg, Per Tube, Daily, Marvel Plan, MD .  Ampicillin-Sulbactam (UNASYN) 3 g in sodium chloride 0.9 % 100 mL IVPB, 3 g, Intravenous, Q6H, Rumbarger, Faye Ramsay, RPH, Stopped at 12/02/17 1133 .  aspirin tablet 325 mg, 325 mg, Per Tube, Daily, Marvel Plan, MD, 325 mg at 12/02/17 1025 .  [START ON 12/02/2017] atorvastatin (LIPITOR) tablet 20 mg, 20 mg, Per Tube, Daily, Marvel Plan, MD .  budesonide (PULMICORT) nebulizer solution 0.5 mg, 0.5 mg, Nebulization, BID, Hammonds, Curt Jews, MD, 0.5 mg at 12/02/17 0718 .  chlorhexidine (PERIDEX) 0.12 % solution 15 mL, 15 mL, Mouth Rinse, BID, Micki Riley, MD, 15 mL at 12/02/17 1133 .  [START ON 12/12/2017] citalopram (CELEXA) tablet  40 mg, 40 mg, Per Tube, Daily, Marvel Plan, MD .  dexmedetomidine (PRECEDEX) 200 MCG/50ML (4 mcg/mL) infusion, 0.2-1.4 mcg/kg/hr, Intravenous, Continuous, Hammonds, Curt Jews, MD, Last Rate: 12.8 mL/hr at 12/02/17 1308, 0.5 mcg/kg/hr at 12/02/17 1308 .  enalapril (VASOTEC) tablet 10 mg, 10 mg, Per Tube, BID, Marvel Plan, MD, 10 mg at 12/02/17 1025 .  feeding supplement (OSMOLITE 1.5 CAL) liquid 1,000 mL, 1,000 mL, Per Tube, Continuous, Marcelle Smiling, MD, Last Rate: 55 mL/hr at 12/02/17 1127, 1,000 mL at 12/02/17 1127 .  feeding supplement (PRO-STAT SUGAR FREE 64) liquid 30 mL, 30 mL, Per Tube, BID, Marcelle Smiling, MD, 30 mL at 12/02/17 0941 .  folic acid (FOLVITE) tablet 1 mg, 1 mg, Per Tube, Daily, Selmer Dominion B, NP, 1 mg at 12/02/17 0941 .  hydrALAZINE (APRESOLINE) injection 10 mg, 10 mg, Intravenous, Q4H PRN, Marcelle Smiling, MD, 10 mg at 12/02/17 1025 .  insulin aspart (novoLOG) injection 0-15 Units, 0-15 Units, Subcutaneous, Q4H, Tobey Grim, NP, 11 Units at 12/02/17 1123 .  ipratropium-albuterol  (DUONEB) 0.5-2.5 (3) MG/3ML nebulizer solution 3 mL, 3 mL, Nebulization, TID, Hammonds, Curt Jews, MD, 3 mL at 12/02/17 1325 .  labetalol (NORMODYNE,TRANDATE) injection 10 mg, 10 mg, Intravenous, Q2H PRN, Marcelle Smiling, MD, 10 mg at 12/01/17 2223 .  LORazepam (ATIVAN) injection 1-2 mg, 1-2 mg, Intravenous, Q6H, Marcelle Smiling, MD, 2 mg at 12/02/17 0940 .  LORazepam (ATIVAN) injection 2 mg, 2 mg, Intravenous, Q2H PRN, Selmer Dominion B, NP, 2 mg at 11/30/17 0042 .  MEDLINE mouth rinse, 15 mL, Mouth Rinse, q12n4p, Micki Riley, MD, 15 mL at 12/01/17 1533 .  multivitamin liquid 15 mL, 15 mL, Per Tube, Daily, Selmer Dominion B, NP, 15 mL at 12/02/17 0941 .  nicardipine (CARDENE) 20mg  in 0.86% saline IV infusion (0.1 mg/ml), 3-15 mg/hr, Intravenous, Continuous, Lynnell Jude, MD, Stopped at 11/29/17 0345 .  pantoprazole sodium (PROTONIX) 40 mg/20 mL oral suspension 40 mg, 40 mg, Per Tube, QHS, Micki Riley, MD, 40 mg at 12/01/17 2140 .  thiamine (VITAMIN B-1) tablet 100 mg, 100 mg, Per Tube, Daily, Selmer Dominion B, NP, 100 mg at 12/02/17 0941 .  vancomycin (VANCOCIN) IVPB 1000 mg/200 mL premix, 1,000 mg, Intravenous, Q12H, Rumbarger, Faye Ramsay, RPH, Stopped at 12/02/17 0050   OBJECTIVE:   VITAL SIGNS: BP (!) 156/72   Pulse 89   Temp 98.8 F (37.1 C) (Axillary)   Resp (!) 36   Ht 6' (1.829 m)   Wt 100.7 kg (222 lb 0.1 oz)   SpO2 97%   BMI 30.11 kg/m  Vitals:   12/02/17 1130 12/02/17 1200 12/02/17 1230 12/02/17 1325  BP: (!) 178/82 (!) 163/83 (!) 177/76 (!) 156/72  Pulse: 77 70 76 89  Resp: (!) 25 (!) 30 (!) 23 (!) 36  Temp:      TempSrc:      SpO2: 94% 97% 96% 97%  Weight:      Height:        HEMODYNAMICS:    VENTILATOR SETTINGS: FiO2 (%):  [35 %-40 %] 35 %  INTAKE / OUTPUT: I/O last 3 completed shifts: In: 2375.1 [I.V.:505.1; NG/GT:1870] Out: 5250 [Urine:5250]  PHYSICAL EXAMINATION: General: Somnolent. Unable to assess orientation due to  dysphasia/aphasia. Neuro: pupils midline (no longer demonstrating gaze preference). Pupils equal, 2 mm. R Babinksi. DTR 1+ @ RUE, 2+ @ LUE, 1+ @ RLE, 1+ @ LLE. Neck: Right carotid endarterectomy scar.  There is no JVD CHEST: Symmetric in development  and expansion. Diminished air entry. B rales and rhonchi. Regular S1 and S2 without murmur, rub or gallop. ABDOMEN: obese, soft and nontender. no organomegaly, no icterus. EXTREMITIES: no dependent edema. No clubbing. No cyanosis.   LABS:  BMET Recent Labs  Lab 11/30/17 0530 12/01/17 0725 12/02/17 0009  NA 142 144 147*  K 3.2* 3.7 3.5  CL 107 110 108  CO2 25 23 27   BUN 24* 26* 21*  CREATININE 0.68 0.63 0.73  GLUCOSE 181* 273* 303*    Electrolytes Recent Labs  Lab 11/30/17 0530 11/30/17 1622 12/01/17 0725 12/02/17 0009  CALCIUM 9.0  --  8.5* 8.8*  MG 2.0 1.9 2.0 1.8  PHOS 3.0 2.5 3.2  --     CBC Recent Labs  Lab 11/28/17 2206 11/29/17 1211 12/01/17 0935  WBC 13.0* 10.6* 8.4  HGB 14.4 14.3 13.3  HCT 43.1 42.4 40.6  PLT 247 230 248    Coag's Recent Labs  Lab 02-Sep-2018 1441  APTT 26  INR 1.10    Sepsis Markers Recent Labs  Lab 11/28/17 2206 11/29/17 1211 11/30/17 0530 12/01/17 0935  LATICACIDVEN 1.4  --   --   --   PROCALCITON  --  0.16 0.11 <0.10    ABG Recent Labs  Lab 11/28/17 2200 11/29/17 1006 12/01/17 2350  PHART 7.388 7.405 7.433  PCO2ART 40.3 42.4 48.8*  PO2ART 239.0* 223.0* 84.0    Liver Enzymes Recent Labs  Lab 02-Sep-2018 1441 11/28/17 1645  AST 20 37  ALT 19 40  ALKPHOS 73 76  BILITOT 0.7 1.1  ALBUMIN 3.4* 3.0*    Cardiac Enzymes No results for input(s): TROPONINI, PROBNP in the last 168 hours.  Glucose Recent Labs  Lab 12/01/17 1556 12/01/17 2024 12/01/17 2310 12/02/17 0411 12/02/17 0716 12/02/17 1115  GLUCAP 259* 234* 280* 387* 304* 320*    Imaging Dg Chest Port 1 View  Result Date: 12/02/2017 CLINICAL DATA:  Short of breath EXAM: PORTABLE CHEST 1 VIEW  COMPARISON:  12/01/2017, 11/30/2017, 11/28/2017 FINDINGS: Esophageal tube tip overlies the expected location of ligament of Treitz. Stable to slight decrease in size of right pleural effusion or thickening. Patchy bibasilar atelectasis or infiltrates. Stable cardiomegaly. No pneumothorax. IMPRESSION: 1. Stable to slight decrease in size of right pleural effusion or thickening. Continued right greater than left bibasilar atelectasis or infiltrates. 2. Continued cardiomegaly with mild vascular congestion and interstitial edema. Electronically Signed   By: Jasmine PangKim  Fujinaga M.D.   On: 12/02/2017 00:03    CULTURES: Results for orders placed or performed during the hospital encounter of 02-Sep-2018  MRSA PCR Screening     Status: None   Collection Time: 11/26/17  2:07 AM  Result Value Ref Range Status   MRSA by PCR NEGATIVE NEGATIVE Final    Comment:        The GeneXpert MRSA Assay (FDA approved for NASAL specimens only), is one component of a comprehensive MRSA colonization surveillance program. It is not intended to diagnose MRSA infection nor to guide or monitor treatment for MRSA infections.   Culture, blood (routine x 2)     Status: None (Preliminary result)   Collection Time: 11/29/17 11:16 AM  Result Value Ref Range Status   Specimen Description BLOOD RIGHT ANTECUBITAL  Final   Special Requests IN PEDIATRIC BOTTLE Blood Culture adequate volume  Final   Culture  Setup Time   Final    GRAM POSITIVE COCCI IN PEDIATRIC BOTTLE CRITICAL VALUE NOTED.  VALUE IS CONSISTENT WITH PREVIOUSLY REPORTED AND CALLED  VALUE.    Culture   Final    GRAM POSITIVE COCCI CULTURE REINCUBATED FOR BETTER GROWTH    Report Status PENDING  Incomplete  Culture, blood (routine x 2)     Status: Abnormal (Preliminary result)   Collection Time: 11/29/17 12:00 PM  Result Value Ref Range Status   Specimen Description BLOOD RIGHT ANTECUBITAL  Final   Special Requests IN PEDIATRIC BOTTLE Blood Culture adequate volume   Final   Culture  Setup Time   Final    GRAM POSITIVE COCCI IN CLUSTERS IN PEDIATRIC BOTTLE CRITICAL RESULT CALLED TO, READ BACK BY AND VERIFIED WITH: J. MILLEN PHARMD, AT 1621 11/30/17 BY D. VANHOOK    Culture STAPHYLOCOCCUS SPECIES (COAGULASE NEGATIVE) (A)  Final   Report Status PENDING  Incomplete  Blood Culture ID Panel (Reflexed)     Status: Abnormal   Collection Time: 11/29/17 12:00 PM  Result Value Ref Range Status   Enterococcus species NOT DETECTED NOT DETECTED Final   Listeria monocytogenes NOT DETECTED NOT DETECTED Final   Staphylococcus species DETECTED (A) NOT DETECTED Final    Comment: Methicillin (oxacillin) resistant coagulase negative staphylococcus. Possible blood culture contaminant (unless isolated from more than one blood culture draw or clinical case suggests pathogenicity). No antibiotic treatment is indicated for blood  culture contaminants. CRITICAL RESULT CALLED TO, READ BACK BY AND VERIFIED WITH: J. MILLEN PHARMD, AT 1621 11/30/17 BY D. VANHOOK    Staphylococcus aureus NOT DETECTED NOT DETECTED Final   Methicillin resistance DETECTED (A) NOT DETECTED Final    Comment: CRITICAL RESULT CALLED TO, READ BACK BY AND VERIFIED WITH: J. MILLEN PHARMD, AT 1621 11/30/17 BY D. VANHOOK    Streptococcus species NOT DETECTED NOT DETECTED Final   Streptococcus agalactiae NOT DETECTED NOT DETECTED Final   Streptococcus pneumoniae NOT DETECTED NOT DETECTED Final   Streptococcus pyogenes NOT DETECTED NOT DETECTED Final   Acinetobacter baumannii NOT DETECTED NOT DETECTED Final   Enterobacteriaceae species DETECTED (A) NOT DETECTED Final    Comment: Enterobacteriaceae represent a large family of gram negative bacteria, not a single organism. Refer to culture for further identification. CRITICAL RESULT CALLED TO, READ BACK BY AND VERIFIED WITH: J. MILLEN PHARMD, AT 1621 11/30/17 BY D. VANHOOK    Enterobacter cloacae complex NOT DETECTED NOT DETECTED Final   Escherichia coli NOT  DETECTED NOT DETECTED Final   Klebsiella oxytoca NOT DETECTED NOT DETECTED Final   Klebsiella pneumoniae NOT DETECTED NOT DETECTED Final   Proteus species NOT DETECTED NOT DETECTED Final   Serratia marcescens NOT DETECTED NOT DETECTED Final   Carbapenem resistance NOT DETECTED NOT DETECTED Final   Haemophilus influenzae NOT DETECTED NOT DETECTED Final   Neisseria meningitidis NOT DETECTED NOT DETECTED Final   Pseudomonas aeruginosa NOT DETECTED NOT DETECTED Final   Candida albicans NOT DETECTED NOT DETECTED Final   Candida glabrata NOT DETECTED NOT DETECTED Final   Candida krusei NOT DETECTED NOT DETECTED Final   Candida parapsilosis NOT DETECTED NOT DETECTED Final   Candida tropicalis NOT DETECTED NOT DETECTED Final    OTHER STUDIES:   ECHO (11/26/2017) - Left ventricle: Inferobasal hypokinesis. Wall thickness was increased in a pattern of mild LVH. Systolic function was normal.  The estimated ejection fraction was in the range of 50% to 55%.Doppler parameters are consistent with both elevated ventricular end-diastolic filling pressure and elevated left atrial filling pressure. - Left atrium: The atrium was moderately dilated. - Atrial septum: Marked hypertrophy of the basal atrial septum. Likely lipomatous but consider TEE  given setting of CVA. No defect or patent foramen ovale was identified.  ANTIBIOTICS: Vancomycin 1/11 for pneumonia and MR-CONS bacteremia Unasyn 1/   FAMILY  - Updates: nephew was updated yesterday evening.  - Inter-disciplinary family meet or Palliative Care meeting due by:     Marcelle Smiling, MD Board Certified by the ABIM, Pulmonary Diseases & Critical Care Medicine  [facility] Pager: [phone]  12/02/2017, 1:34 PM

## 2017-12-02 NOTE — Consult Note (Signed)
Consultation Note Date: 12/02/2017   Patient Name: Todd [name]  DOB: 06-03-1946  MRN: [id number]  Age / Sex: 72 y.o., male  PCP: Velna Hatchet, MD Referring Physician: Rosalin Hawking, MD  Reason for Consultation: Establishing goals of care  HPI/Patient Profile: 72 y.o. male  with past medical history of PAD, DM, CHF, CAD, COPD who was admitted on 12/12/2017 with right sided weakness.  He was found to have an acute left MCA stroke.  He had a history of alcohol use as well and was felt to be going thru withdraw.  Clinical Assessment and Goals of Care:  I have reviewed medical records including EPIC notes, labs and imaging, received report from the bedside RN, assessed the patient and then met at the bedside along with his HCPOA and nephew Trip to discuss diagnosis prognosis, GOC, EOL wishes, disposition and options.  I introduced Palliative Medicine as specialized medical care for people living with serious illness. It focuses on providing relief from the symptoms and stress of a serious illness. The goal is to improve quality of life for both the patient and the family.  We discussed a brief life review of the patient. Mr. Glazier made good investments years ago and was able to live near [place] without being employed.  His investments eventually ran out and he returned to the [place] area to be closer to family.  Mr. Gueye is not particularly religious.  His nephew Trip manages his affairs.  As far as functional and nutritional status the patient was living independently.  Trip mentions that he was having some balance problems and memory problems and had become kind of a recluse in his apartment.   I attempted to elicit values and goals of care important to the patient.  Trip feels strongly that he would not want to live in a facility - particularly not with deficits from a stroke.  The difference between  aggressive medical intervention and comfort care was considered in light of the patient's goals of care. Hospice and Palliative Care services outpatient were explained and offered.  Trip feels strongly that his nephew is suffering - and I agree with his assessment.  He wants to convert to comfort care.   If his Uncle stabilizes on comfort care Trip wants him to go to [facility] [place] at [place]. [employer].  Questions and concerns were addressed.  The family was encouraged to call with questions or concerns.    Primary Decision Maker:  NEXT OF KIN HCPOA Trip Regan    SUMMARY OF RECOMMENDATIONS    Discontinue interventions not associated with comfort - including antibiotics.  Initiate comfort medications including morphine GTT and bolus  Will re-assess 1st thing in the morning to determine if he is stable enough to request a bed at [facility].  Code Status/Advance Care Planning:  DNR   Symptom Management:   Full Comfort Care  Morphine gtt and boluses.  Wean to N/C.  Wean from precedex gtt  D/C cor trak.  Ativan for agitation  Robinul for secretions.   Prognosis:  Hours to days    Discharge Planning: Anticipated Hospital Death  [place]      Primary Diagnoses: Present on Admission: . Acute ischemic left middle cerebral artery (MCA) stroke ([place]) . Hypertension, essential . DM (diabetes mellitus), type 2 with neurological complications ([place])   I have reviewed the medical record, interviewed the patient and family, and examined the patient. The following aspects are pertinent.  Past Medical History:  Diagnosis Date  . 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 Carotid stenosis   . CHF (congestive heart failure) ([place])   . Depression   . Diabetes mellitus without complication ([place])   . Hyperlipidemia   . Hypertension   . Obesity   . Peripheral arterial disease ([place])   . Polyuria    Social History    Socioeconomic History  . Marital status: Unknown    Spouse name: None  . Number of children: None  . Years of education: None  . Highest education level: None  Social Needs  . Financial resource strain: None  . Food insecurity - worry: None  . Food insecurity - inability: None  . Transportation needs - medical: None  . Transportation needs - non-medical: None  Occupational History  . None  Tobacco Use  . Smoking status: Current Every Day Smoker    Types: Cigarettes  . Smokeless tobacco: Never Used  . Tobacco comment: Up 1 1/2 pks per day  Substance and Sexual Activity  . Alcohol use: Yes    Alcohol/week: 4.2 oz    Types: 7 Glasses of wine per week  . Drug use: No  . Sexual activity: None  Other Topics Concern  . None  Social History Narrative  . None   Family History  Problem Relation Age of Onset  . Peripheral vascular disease Father    Scheduled Meds: .  stroke: mapping our early stages of recovery book   Does not apply Once  . chlorhexidine  15 mL Mouth Rinse BID  . glycopyrrolate  0.4 mg Intravenous TID  . insulin glargine  30 Units Subcutaneous QHS  . LORazepam  1-2 mg Intravenous Q6H  . mouth rinse  15 mL Mouth Rinse q12n4p   Continuous Infusions: . dexmedetomidine (PRECEDEX) IV infusion 0.502 mcg/kg/hr (12/02/17 1700)  . morphine    . vancomycin 1,000 mg (12/02/17 1430)   PRN Meds:.acetaminophen **OR** acetaminophen (TYLENOL) oral liquid 160 mg/5 mL **OR** acetaminophen, acetaminophen **OR** acetaminophen, albuterol, antiseptic oral rinse, glycopyrrolate **OR** glycopyrrolate **OR** glycopyrrolate, haloperidol **OR** haloperidol **OR** haloperidol lactate, LORazepam **OR** LORazepam **OR** LORazepam, morphine, ondansetron **OR** ondansetron (ZOFRAN) IV, polyvinyl alcohol No Known Allergies Review of Systems unable to answer.  Physical Exam  Well developed elderly male - with some agitation and distress.  Throwing left leg off the bed. Moaning. CV irreg  rate  Resp coarse breath sounds, increased work of breathing   Vital Signs: BP (!) 187/71   Pulse 73   Temp 99.2 F (37.3 C) (Axillary)   Resp (!) 23   Ht 6' (1.829 m)   Wt 100.7 kg (222 lb 0.1 oz)   SpO2 95%   BMI 30.11 kg/m  Pain Assessment: CPOT       SpO2: SpO2: 95 % O2 Device:SpO2: 95 % O2 Flow Rate: .O2 Flow Rate (L/min): 6 L/min  IO: Intake/output summary:   Intake/Output Summary (Last 24 hours) at 12/02/2017 1753 Last data  filed at 12/02/2017 1700 Gross per 24 hour  Intake 2680.22 ml  Output 3250 ml  Net -569.78 ml    LBM: Last BM Date: (PTA) Baseline Weight: Weight: 102 kg (224 lb 13.9 oz) Most recent weight: Weight: 100.7 kg (222 lb 0.1 oz)     Palliative Assessment/Data: 20%     Time In: 4:30 Time Out: 6:00 Time Total: 90 min. Greater than 50%  of this time was spent counseling and coordinating care related to the above assessment and plan.  Signed by: Florentina Jenny, PA-C Palliative Medicine Pager: [phone]  Please contact Palliative Medicine Team phone at [phone] for questions and concerns.  For individual provider: See Shea Evans

## 2017-12-02 NOTE — Progress Notes (Signed)
SLP Cancellation Note  Patient Details Name: Todd BlaseWilliam B Lund MRN: [id number] DOB: 10/11/1946   Cancelled treatment:       Reason Eval/Treat Not Completed: Medical issues which prohibited therapy. Pt remains on ventimask, still lethargic. Discussed with RN - will hold PO trials again today.   Maxcine Hamaiewonsky, Shadie Sweatman 12/02/2017, 9:49 AM  Maxcine HamLaura Paiewonsky, M.A. CCC-SLP [phone]

## 2017-12-03 DIAGNOSIS — I6350 Cerebral infarction due to unspecified occlusion or stenosis of unspecified cerebral artery: Secondary | ICD-10-CM

## 2017-12-03 DIAGNOSIS — Z515 Encounter for palliative care: Secondary | ICD-10-CM

## 2017-12-03 LAB — CULTURE, BLOOD (ROUTINE X 2)
SPECIAL REQUESTS: ADEQUATE
Special Requests: ADEQUATE

## 2017-12-03 MED ORDER — SODIUM CHLORIDE 0.9 % IV SOLN
INTRAVENOUS | Status: DC
  Administered 2017-12-03: 12:00:00 via INTRAVENOUS

## 2017-12-03 MED ORDER — WHITE PETROLATUM EX OINT
TOPICAL_OINTMENT | CUTANEOUS | Status: AC
  Filled 2017-12-03: qty 28.35

## 2017-12-03 MED ORDER — MORPHINE BOLUS VIA INFUSION
4.0000 mg | Freq: Once | INTRAVENOUS | Status: DC
  Filled 2017-12-03: qty 4

## 2017-12-03 MED ORDER — LORAZEPAM 2 MG/ML IJ SOLN
2.0000 mg | Freq: Four times a day (QID) | INTRAMUSCULAR | Status: DC
  Administered 2017-12-03: 2 mg via INTRAVENOUS
  Filled 2017-12-03: qty 1

## 2017-12-03 MED ORDER — MORPHINE BOLUS VIA INFUSION
2.0000 mg | INTRAVENOUS | Status: DC | PRN
  Administered 2017-12-03 (×3): 2 mg via INTRAVENOUS
  Filled 2017-12-03: qty 2

## 2017-12-03 MED ORDER — LORAZEPAM 2 MG/ML IJ SOLN: 1.0000 mg | INTRAMUSCULAR | Status: DC | PRN

## 2017-12-21 NOTE — Progress Notes (Signed)
Palliative Medicine RN Note: Arrived to check symptoms. Patient is on room air with morphine infusion at 5mg /hour. He is using accessory muscles to breathe, and his resp rate is over 20. Feet/lower legs are cool, and arms and hands are swollen and sweaty. I removed the blanket but left the sheet in place.  Discussed plan, goals, patient status with nephew Redmond Schoolripp, who is at bedside, and with Tripp's wife. Their goal is completely comfort. They do not wish for Bill to have any suffering or discomfort, and they would like aggressive symptom management. They are interested in moving to the hospice home in [place], as long as he is stable for transport and we do not expect death in the next few hours.  Plan made with family, RN, SW, and PA Clerance LavMarianne Dellinger:  1. Give prn morphine now and increase continuous rate to 6mg /hour. Give prn doses liberally to ensure comfort. Patient has a history of opioid use/abuse, so he may need higher than average dosing to achieve comfort.  2. Start scheduled ativan.   3. Continue referral to hospice home. If [facility] is stable for transport, family would like him to go there.  4. I will follow up this afternoon to ensure that comfort has been achieved with these changes.  Please call if we are needed before my return this afternoon.  Margret ChanceMelanie G. Shondra Capps, RN, BSN, [facility] Palliative Medicine Team 12/04/2017 12:28 PM Office [phone]

## 2017-12-21 NOTE — Social Work (Signed)
Clinical Social Worker facilitated patient discharge including contacting patient family and facility to confirm patient discharge plans.  Clinical information faxed to facility and family agreeable with plan.  CSW arranged [facility]30[facility].   RN to call [phone] for charge nurse with report prior to discharge.  Clinical Social Worker will sign off for now as social work intervention is no longer needed. Please consult us again if new need arises.  Doy HutchingIsabel H Kenzley Ke, LCSWA Clinical Social Worker

## 2017-12-21 NOTE — Progress Notes (Signed)
Daily Progress Note   Patient Name: Todd [name]       Date: 01/31/18 DOB: 04/04/1946  Age: 72 y.o. MRN#: [id number] Attending Physician: Marvel PlanXu, Jindong, MD Primary Care Physician: Alysia PennaHolwerda, Scott, MD Admit Date: 12/05/2017  Reason for Consultation/Follow-up: Establishing goals of care, Pain control, Psychosocial/spiritual support and Terminal Care  Subjective: Patient actively dying.  Trip (Nephew) asleep at bedside.  Talked with Trip about weaning to room air and the fact that the patient is now actively dying and not appropriate to transport to Hospice. Discussed medications.  How to note signs of distress.  Trip is sad but overall pleased with how his Kateri McUncle is doing now.   Assessment: Patient actively dying.  Appears comfortable  Patient Profile/HPI: 72 y.o. male  with past medical history of PAD, DM, CHF, CAD, COPD who was admitted on 11/24/2017 with right sided weakness.  He was found to have an acute left MCA stroke.  He had a history of alcohol use as well and was felt to be going thru withdraw.     Length of Stay: 8  Current Medications: Scheduled Meds:  .  stroke: mapping our early stages of recovery book   Does not apply Once  . chlorhexidine  15 mL Mouth Rinse BID  . glycopyrrolate  0.4 mg Intravenous TID  . mouth rinse  15 mL Mouth Rinse q12n4p  . white petrolatum        Continuous Infusions: . dexmedetomidine (PRECEDEX) IV infusion Stopped (12/02/17 1937)  . morphine 5 mg/hr (12/02/17 1900)    PRN Meds: acetaminophen **OR** acetaminophen (TYLENOL) oral liquid 160 mg/5 mL **OR** acetaminophen, acetaminophen **OR** acetaminophen, albuterol, antiseptic oral rinse, glycopyrrolate **OR** glycopyrrolate **OR** glycopyrrolate, haloperidol **OR** haloperidol **OR**  haloperidol lactate, LORazepam **OR** LORazepam **OR** LORazepam, morphine, ondansetron **OR** ondansetron (ZOFRAN) IV, polyvinyl alcohol  Physical Exam        Well developed, diaphoretic, cheyenne stokes breathing, feverish. CV steady, RRR   Vital Signs: BP (!) 200/89 (BP Location: Right Arm)   Pulse 79   Temp 98.6 F (37 C) (Axillary)   Resp 18   Ht 6' (1.829 m)   Wt 100.7 kg (222 lb 0.1 oz)   SpO2 (!) 85%   BMI 30.11 kg/m  SpO2: SpO2: (!) 85 % O2 Device: O2 Device: Nasal Cannula O2 Flow Rate:  O2 Flow Rate (L/min): 2 L/min  Intake/output summary:   Intake/Output Summary (Last 24 hours) at 12-30-2017 0732 Last data filed at December 30, 2017 0000 Gross per 24 hour  Intake 1894.45 ml  Output 1215 ml  Net 679.45 ml   LBM: Last BM Date: (PTA) Baseline Weight: Weight: 102 kg (224 lb 13.9 oz) Most recent weight: Weight: 100.7 kg (222 lb 0.1 oz)       Palliative Assessment/Data: 10%      Patient Active Problem List   Diagnosis Date Noted  . Acute respiratory failure with hypoxia (HCC)   . Encounter for nasogastric (NG) tube placement   . SOB (shortness of breath)   . Palliative care encounter   . Comfort measures only status   . Acute embolic stroke (HCC)   . Wheezing   . Cerebrovascular accident (CVA) due to embolism of cerebral artery (HCC)   . Aspiration pneumonia (HCC) 11/29/2017  . Abnormal CXR 11/28/2017  . Hypertension, essential 11/28/2017  . DM (diabetes mellitus), type 2 with neurological complications (HCC) 11/28/2017  . Alcohol withdrawal delirium (HCC) 11/28/2017  . COPD with respiratory distress, acute (HCC)   . Acute ischemic left middle cerebral artery (MCA) stroke (HCC) 11/22/2017  . Bilateral carotid artery stenosis 08/28/2016  . Atherosclerosis of native arteries of extremity with intermittent claudication (HCC) 07/09/2015    Palliative Care Plan    Recommendations/Plan:  Continue comfort measures Patient actively dying.  Unstable for  transport.   Goals of Care and Additional Recommendations:  Limitations on Scope of Treatment: Full Comfort Care  Code Status:  DNR  Prognosis:   Hours - Days after L MCA stroke, cardiomyopathy, long term ETOH abuse.   Discharge Planning:  Anticipated Hospital Death  Care plan was discussed with bedside RN and Nephew  Thank you for allowing the Palliative Medicine Team to assist in the care of this patient.  Total time spent:  35 min.     Greater than 50%  of this time was spent counseling and coordinating care related to the above assessment and plan.  Norvel Richards, PA-C Palliative Medicine  Please contact Palliative MedicineTeam phone at [phone] for questions and concerns between 7 am - 7 pm.   Please see AMION for individual provider pager numbers.

## 2017-12-21 NOTE — Progress Notes (Signed)
 2mg  morphine bolus administered to begin weaning off of O2 via Learned to room air.  Patient on 4L at time, weaned down to 2L.  Will continue to wean over next few hours.  Patient's nephew (Tripp) at bedside.  Will continue to monitor.

## 2017-12-21 NOTE — Discharge Summary (Signed)
Stroke Discharge Summary  Patient ID: Todd [name]   MRN: [id number]      DOB: March 19, 1946  Date of Admission: 2017/11/30 Date of Discharge: 12/06/2017  Attending Physician:  Marvel Plan, MD, Stroke MD Consultant(s):   Treatment Team:  Marcelle Smiling, MD pulmonary/intensive care , Interventional Radiology, Palliative Care Patient's PCP:  Alysia Penna, MD  DISCHARGE DIAGNOSIS: Principal Problem:   Acute ischemic left middle cerebral artery (MCA) stroke (HCC) Active Problems:   COPD with respiratory distress, acute (HCC)   Abnormal CXR   Hypertension, essential   DM (diabetes mellitus), type 2 with neurological complications (HCC)   Alcohol withdrawal delirium (HCC)   Aspiration pneumonia (HCC)   Acute embolic stroke (HCC)   Wheezing   Cerebrovascular accident (CVA) due to embolism of cerebral artery (HCC)   Acute respiratory failure with hypoxia (HCC)   Encounter for nasogastric (NG) tube placement   SOB (shortness of breath)   Palliative care encounter   Comfort measures only status   Terminal care   Past Medical History:  Diagnosis Date  . 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 Carotid stenosis   . CHF (congestive heart failure) (HCC)   . Depression   . Diabetes mellitus without complication (HCC)   . Hyperlipidemia   . Hypertension   . Obesity   . Peripheral arterial disease (HCC)   . Polyuria    Past Surgical History:  Procedure Laterality Date  . CAROTID ENDARTERECTOMY    . IR ANGIO INTRA EXTRACRAN SEL COM CAROTID INNOMINATE BILAT MOD SED  30-Nov-2017  . IR ANGIO VERTEBRAL SEL SUBCLAVIAN INNOMINATE UNI R MOD SED  11/30/17  . IR ANGIO VERTEBRAL SEL VERTEBRAL UNI L MOD SED  2017/11/30  . RADIOLOGY WITH ANESTHESIA N/A Nov 30, 2017   Procedure: RADIOLOGY WITH ANESTHESIA;  Surgeon: Julieanne Cotton, MD;  Location: [facility] OR;  Service: Radiology;  Laterality: N/A;    Allergies as of 11/25/2017   No Known Allergies     Medication List     STOP taking these medications   amLODipine 5 MG tablet Commonly known as:  NORVASC   aspirin 81 MG tablet   atorvastatin 20 MG tablet Commonly known as:  LIPITOR   citalopram 40 MG tablet Commonly known as:  CELEXA   dicyclomine 20 MG tablet Commonly known as:  BENTYL   insulin lispro protamine-lispro (75-25) 100 UNIT/ML Susp injection Commonly known as:  HUMALOG 75/25 MIX   methocarbamol 500 MG tablet Commonly known as:  ROBAXIN   oxybutynin 5 MG tablet Commonly known as:  DITROPAN   oxyCODONE-acetaminophen 5-325 MG tablet Commonly known as:  PERCOCET/ROXICET   tamsulosin 0.4 MG Caps capsule Commonly known as:  FLOMAX   traMADol 50 MG tablet Commonly known as:  ULTRAM      LABORATORY STUDIES CBC    Component Value Date/Time   WBC 8.4 12/01/2017 0935   RBC 4.31 12/01/2017 0935   HGB 13.3 12/01/2017 0935   HCT 40.6 12/01/2017 0935   PLT 248 12/01/2017 0935   MCV 94.2 12/01/2017 0935   MCH 30.9 12/01/2017 0935   MCHC 32.8 12/01/2017 0935   RDW 13.5 12/01/2017 0935   LYMPHSABS 1.3 11/29/2017 1211   MONOABS 1.1 (H) 11/29/2017 1211   EOSABS 0.0 11/29/2017 1211   BASOSABS 0.0 11/29/2017 1211   CMP    Component Value Date/Time   NA 147 (H) 12/02/2017 0009   K 3.5 12/02/2017 0009  CL 108 12/02/2017 0009   CO2 27 12/02/2017 0009   GLUCOSE 303 (H) 12/02/2017 0009   BUN 21 (H) 12/02/2017 0009   CREATININE 0.73 12/02/2017 0009   CALCIUM 8.8 (L) 12/02/2017 0009   PROT 6.6 11/28/2017 1645   ALBUMIN 3.0 (L) 11/28/2017 1645   AST 37 11/28/2017 1645   ALT 40 11/28/2017 1645   ALKPHOS 76 11/28/2017 1645   BILITOT 1.1 11/28/2017 1645   GFRNONAA >60 12/02/2017 0009   GFRAA >60 12/02/2017 0009   COAGS Lab Results  Component Value Date   INR 1.10 December 17, 2017   Lipid Panel    Component Value Date/Time   CHOL 168 11/26/2017 0524   TRIG 96 11/26/2017 0524   HDL 44 11/26/2017 0524   CHOLHDL 3.8 11/26/2017 0524   VLDL 19 11/26/2017 0524   LDLCALC 105 (H)  11/26/2017 0524   HgbA1C  Lab Results  Component Value Date   HGBA1C 7.6 (H) 11/26/2017   Urinalysis    Component Value Date/Time   COLORURINE STRAW (A) 11/29/2017 1427   APPEARANCEUR CLEAR 11/29/2017 1427   LABSPEC 1.010 11/29/2017 1427   PHURINE 5.0 11/29/2017 1427   GLUCOSEU NEGATIVE 11/29/2017 1427   HGBUR NEGATIVE 11/29/2017 1427   BILIRUBINUR NEGATIVE 11/29/2017 1427   KETONESUR 20 (A) 11/29/2017 1427   PROTEINUR NEGATIVE 11/29/2017 1427   NITRITE NEGATIVE 11/29/2017 1427   LEUKOCYTESUR NEGATIVE 11/29/2017 1427   Urine Drug Screen No results found for: LABOPIA, COCAINSCRNUR, LABBENZ, AMPHETMU, THCU, LABBARB  Alcohol Level No results found for: ETH  SIGNIFICANT DIAGNOSTIC STUDIES  Ct Angio Head and Neck W Or Wo Contrast with Perfusion Result Date: 2017-12-17 IMPRESSION: 1. Proximal left M2 superior division occlusion with partial distal reconstitution. 2. Extensive ICA siphon atherosclerosis with moderate to severe stenoses bilaterally. 3. 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 Occlusion of the nondominant left vertebral artery distal to PICA. Possible severe right V4 stenoses, not well evaluated. 4. Moderate to severe proximal left PCA stenoses. 5. Patent cervical carotid arteries without significant stenosis. Prior right carotid endarterectomy. 6. Aortic Atherosclerosis (ICD10-I70.0) and Emphysema (ICD10-J43.9). 7. Moderate-sized, loculated right pleural effusion with pleural thickening suggesting chronicity. 8. 11 mm part solid pulmonary nodule in the left upper lobe.  Ct Head Wo Contrast Result Date: 12/17/17 IMPRESSION: 1. No intracranial hemorrhage. 2. Unchanged appearance of the brain. No evidence of new or enlarging infarct.   Mr Brain Wo Contrast Result Date: 11/26/2017 IMPRESSION: 1. Acute/early subacute infarction involving left posterior limb of internal capsule extending into left anterior parahippocampal gyrus and hippocampus.  Additional punctate infarct within left inferior cerebellar hemisphere. No hemorrhage or mass effect. 2. Background of mild chronic microvascular ischemic changes and mild parenchymal volume loss of the brain. 3. 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 4. Mild paranasal sinus disease.   Dg Chest Port 1 View Result Date: 12-17-2017 IMPRESSION: Right pleural effusion and lung opacity, consistent with atelectasis or consolidation.  Ct Head Code Stroke Wo Contrast Result Date: 17-Dec-2017 IMPRESSION: 1. Atrophy and small vessel disease. Cannot exclude early LEFT posterior limb internal capsule infarct. Chronic insult LEFT cerebellum. 2. ASPECTS is 9.   Echocardiogram:                                              Left ventricle: Inferobasal hypokinesis. Wall thickness was increased in a pattern of  mild LVH. Systolic function was normal. The estimated ejection fraction was in the range of 50% to 55%. Doppler parameters are consistent with both elevated ventricular end-diastolic filling pressure and elevated left atrial filling pressure.   HISTORY OF PRESENT ILLNESS  &   HOSPITAL COURSE  IMPRESSION:  Mr. JEROME VIGLIONE is a 72 y.o. male with PMH of carotid stenosis, CHF, diabetes, hyperlipidemia, hypertension, obesity,peripheral arterial disease on aspirin presents withacute onset of rightsided weakness, which progressed to result in hemiplegia. CT head was negative for hemorrhage and patient received IV tPA. CT perfusion showed no perfusion deficit,however CT angiogram showed a M2 occlusion. Given patient's right-sided deficits, gaze deviation patient was deemed a candidate for thrombectomy. MRI reveals: Acute/early subacute infarction Left posterior limb of internal capsule extending into left anterior parahippocampal gyrus and hippocampus Infarct within Left inferior cerebellar hemisphere.  Treated with IV TPA  now persistent agitation and confusion likely due  to superimposed alcohol withdrawal  STROKE:  Suspected Etiology: Left M2 occlusion and  possibly complication of catheter angiogram  Resultant Symptoms: right-sided deficits, gaze deviation  Stroke Risk Factors: diabetes mellitus, hyperlipidemia and hypertension Other Stroke Risk Factors: Advanced age, Cigarette smoker, ETOH use, Obesity, Body mass index is 30.11 kg/m. , Hx stroke, OSA/likely undx, CHF, PAD  PROCEDURES: 12/10/2017  Dr Corliss Skains S/P 4 vessel cerebral arteriogram Rt CFA approach. Findings. 1.No occlusions,stenosis or dissections extracranially or intracranially. Occluded Rt common femoral Below the inguinal ligament With distal recostitution of the profuda from prominent collaterals and brisk run off.  23-Dec-2017 ASSESSMENT:   Patient unresponsive on Morphine IV, Diaphoretic, Snoring, no apneic breathing noted. Pulse regular rate and rhythm, Breath sounds clear. No spontaneous movement of extremities. Nephew at bedside. Will continue comfort care.  PLAN  12-23-17: Comfort care in progress Hopefully discharge to [facility] later today  HX OF STROKES: Small chronic infarct within left middle cerebellar peduncle  Other Active Problems: Principal Problem:   Acute ischemic left middle cerebral artery (MCA) stroke (HCC) Active Problems:   COPD with respiratory distress, acute (HCC)   Abnormal CXR   Hypertension, essential   DM (diabetes mellitus), type 2 with neurological complications (HCC)   Alcohol withdrawal delirium (HCC)   Aspiration pneumonia (HCC)   Acute embolic stroke (HCC)   Wheezing   Cerebrovascular accident (CVA) due to embolism of cerebral artery (HCC)   Acute respiratory failure with hypoxia (HCC)   Encounter for nasogastric (NG) tube placement   SOB (shortness of breath)   Palliative care encounter   Comfort measures only status   Terminal care  DISCHARGE EXAM Blood pressure (!) 200/89, pulse 79, temperature 98.6 F (37 C),  temperature source Axillary, resp. rate 18, height 6' (1.829 m), weight 100.7 kg (222 lb 0.1 oz), SpO2 (!) 85 %.  General -Caucasian male, in no apparent distress, diaphoretic HEENT-  Normocephalic, Cardiovascular - Regular rate and rhythm  Respiratory - Lungs clear bilaterally. No wheezing. snoring Abdomen - soft and non-tender, BS decreased Extremities- no edema or cyanosis Neurological Examination Mental Status: unresponsive on Morphine IV Cranial Nerves: ZO:XWRUEA fields:Chronic right eye blindness,left homonymous hemianopsia III,IV, VI: ptosis not present,forceddeviation to left side. pupils equal, round, reactive to light and accommodation V,VIIRIGHT FACIAL DROOP VIII: hearing appears normal bilaterally IX,X: not tested XI: not tested XII: not tested Motor: withdrawals slowly only to painful stimuli Sensory:intact Plantars: not tested Gait:Not tested  Discharge Diet   Diet NPO time specified liquids  DISCHARGE PLAN  [facility] [place]  Greater than 30 minutes were spent preparing discharge.  Beryl MeagerMary A Costello, ANP-C Neurology Stroke Team 03-Mar-2018 2:35PM  ATTENDING NOTE: I have personally examined this patient, reviewed notes, independently viewed imaging studies, participated in medical decision making and plan of care.ROS completed by me personally and pertinent positives fully documented  I have made any additions or clarifications directly to the above note.     72 year old male with history of R carotid stenosis s/p CEA, CHF, DM, HLD, HTN, PAD admitted for right-sided weakness, slurred speech, left gaze.  Status post TPA.  CT head and neck negative but questionable left M2 stenosis.  INR attempted but no vascular stenosis seen.  MRI showed left PICA/thalamus, left medial temporal lobe, left punctate cerebellum infarct.  EF 50-55%.  A1c 7.6 and LDL 105.  His symptoms initially stabilized, however later, developed  agitation, confusion, difficulty breathing, concerning for alcohol withdrawal.  He was put on Precedex, with benzoyl as needed. SOB has not been improved. CXR stable.   Due to ongoing respiratory distress with severe neuro deficit, discussed with nephrew who is also pt POA, and requested DNR/DNI. Had palliative care consult and then decided comfort care measures. Pt was transferred to 6N for comfort care and subsequently passed away on 12/04/17 1720.   Marvel PlanJindong Trinten Boudoin, MD PhD Stroke Neurology 12/04/2017 12:20 AM

## 2017-12-21 NOTE — Social Work (Addendum)
CSW acknowledging consult for residential hospice placement.  [facility] [place].  CSW to fax clinicals to [name] at [phone]  2:15pm- CSW received confirmation that pt is able [name] at 6:30/6:45. CSW will page palliative/neuro/nurse to let them know.   CSW continuing to follow to support transfer to residential hospice.   Doy HutchingIsabel H Berdie Malter, LCSWA [facility] Clinical Social Work [phone]

## 2017-12-21 NOTE — Progress Notes (Signed)
Patient found with no breathing and no heart beat.  Nephew Richard "Tripp" Regan was present at bedside.  Time of Death 1720 and verified by Mathis DadAngela Ysabela Keisler, RN and Shelbie Hutchingominique Lewis, RN

## 2017-12-21 NOTE — Progress Notes (Signed)
NEUROHOSPITALISTS STROKE TEAM - DAILY PROGRESS NOTE   SUBJECTIVE (INTERVAL HISTORY) Nephew is at the bedside. Comfort care in progress. No significant change from yesterday.    OBJECTIVE Lab Results: CBC:  Recent Labs  Lab 11/28/17 2206 11/29/17 1211 12/01/17 0935  WBC 13.0* 10.6* 8.4  HGB 14.4 14.3 13.3  HCT 43.1 42.4 40.6  MCV 93.1 93.8 94.2  PLT 247 230 248   BMP: Recent Labs  Lab 11/28/17 1645 11/28/17 2206 11/29/17 0449 11/29/17 1655 11/30/17 0530 11/30/17 1622 12/01/17 0725 12/02/17 0009  NA 139  --  142  --  142  --  144 147*  K 3.3*  --  3.6  --  3.2*  --  3.7 3.5  CL 108  --  107  --  107  --  110 108  CO2 22  --  25  --  25  --  23 27  GLUCOSE 159*  --  146*  --  181*  --  273* 303*  BUN 17  --  18  --  24*  --  26* 21*  CREATININE 0.68  --  0.78  --  0.68  --  0.63 0.73  CALCIUM 9.1  --  9.0  --  9.0  --  8.5* 8.8*  MG  --  1.6* 2.1 1.8 2.0 1.9 2.0 1.8  PHOS  --  2.2*  --  2.0* 3.0 2.5 3.2  --    Liver Function Tests:  Recent Labs  Lab 11/28/17 1645  AST 37  ALT 40  ALKPHOS 76  BILITOT 1.1  PROT 6.6  ALBUMIN 3.0*   Thyroid Function Studies:  No results for input(s): TSH, T4TOTAL, T3FREE, THYROIDAB in the last 72 hours. Coagulation Studies:  No results for input(s): APTT, INR in the last 72 hours. PHYSICAL EXAM Temp:  [98.6 F (37 C)-99.2 F (37.3 C)] 98.6 F (37 C) (01/14 0100) Pulse Rate:  [62-103] 79 (01/14 0100) Resp:  [15-36] 18 (01/14 0100) BP: (137-200)/(61-89) 200/89 (01/14 0100) SpO2:  [85 %-100 %] 85 % (01/14 0100) FiO2 (%):  [35 %] 35 % (01/13 1400) General -Caucasian male, in no apparent distress, diaphoretic HEENT-  Normocephalic, Cardiovascular - Regular rate and rhythm  Respiratory - Lungs clear bilaterally. No wheezing. snoring Abdomen - soft and non-tender, BS decreased Extremities- no edema or cyanosis Neurological Examination Mental Status: unresponsive on  Morphine IV Cranial Nerves: II: Visual fields : Chronic right eye blindness , left homonymous hemianopsia  III,IV, VI: ptosis not present, forced deviation to left side. pupils equal, round, reactive to light and accommodation V,VII RIGHT FACIAL DROOP  VIII: hearing appears normal bilaterally IX,X: not tested XI: not tested XII: not tested Motor: withdrawals slowly only to painful stimuli Sensory: intact Plantars: not tested Gait:Not tested  IMAGING:  Ct Angio Head and Neck W Or Wo Contrast with Perfusion Result Date: 12/06/2017 IMPRESSION: 1. Proximal left M2 superior division occlusion with partial distal reconstitution. 2. Extensive ICA siphon atherosclerosis with moderate to severe stenoses bilaterally. 3. 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 Occlusion of the nondominant left vertebral artery distal to PICA. Possible severe right V4 stenoses, not well evaluated. 4. Moderate to severe proximal left PCA stenoses. 5. Patent cervical carotid arteries without significant stenosis. Prior right carotid endarterectomy. 6. Aortic Atherosclerosis (ICD10-I70.0) and Emphysema (ICD10-J43.9). 7. Moderate-sized, loculated right pleural effusion with pleural thickening suggesting chronicity. 8. 11 mm part solid pulmonary nodule in the left upper lobe.  Ct Head  Wo Contrast Result Date: Nov 30, 2017 IMPRESSION: 1. No intracranial hemorrhage. 2. Unchanged appearance of the brain. No evidence of new or enlarging infarct.   Mr Brain Wo Contrast Result Date: 11/26/2017 IMPRESSION: 1. Acute/early subacute infarction involving left posterior limb of internal capsule extending into left anterior parahippocampal gyrus and hippocampus. Additional punctate infarct within left inferior cerebellar hemisphere. No hemorrhage or mass effect. 2. Background of mild chronic microvascular ischemic changes and mild parenchymal volume loss of the brain. 3. Small chronic infarct within left  middle cerebellar peduncle. 4. Mild paranasal sinus disease.   Dg Chest Port 1 View Result Date: 11-30-2017 IMPRESSION: Right pleural effusion and lung opacity, consistent with atelectasis or consolidation.  Ct Head Code Stroke Wo Contrast Result Date: 11/30/17 IMPRESSION: 1. Atrophy and small vessel disease. Cannot exclude early LEFT posterior limb internal capsule infarct. Chronic insult LEFT cerebellum. 2. ASPECTS is 9.   Echocardiogram:                                              Left ventricle: Inferobasal hypokinesis. Wall thickness was   increased in a pattern of mild LVH. Systolic function was normal.   The estimated ejection fraction was in the range of 50% to 55%.   Doppler parameters are consistent with both elevated ventricular   end-diastolic filling pressure and elevated left atrial filling   pressure.     IMPRESSION: [name] is a 72 y.o. male with PMH of carotid stenosis, CHF, diabetes, hyperlipidemia, hypertension, obesity, peripheral arterial disease on aspirin presents with acute onset of right sided weakness, which progressed to result in hemiplegia.  CT head was negative for hemorrhage and patient received IV tPA. CT perfusion showed no perfusion deficit, however CT angiogram showed a M2 occlusion. Given patient's right-sided deficits, gaze deviation patient was deemed a candidate for thrombectomy. MRI reveals: Acute/early subacute infarction Left posterior limb of internal capsule extending into left anterior parahippocampal gyrus and hippocampus Infarct within Left inferior cerebellar hemisphere.  Treated with IV TPA  now persistent agitation and confusion likely due to superimposed alcohol withdrawal  STROKE:  Suspected Etiology: Left M2 occlusion and  possibly complication of catheter angiogram  Resultant Symptoms: right-sided deficits, gaze deviation  Stroke Risk Factors: diabetes mellitus, hyperlipidemia and hypertension Other Stroke Risk Factors:  Advanced age, Cigarette smoker, ETOH use, Obesity, Body mass index is 30.11 kg/m. , Hx stroke, OSA/likely undx, CHF, PAD  PROCEDURES: November 30, 2017  Dr Corliss Skains S/P 4 vessel cerebral arteriogram Rt CFA approach. Findings. 1.No occlusions,stenosis or dissections extracranially or intracranially. Occluded Rt common femoral  Below the inguinal ligament  With distal recostitution of the profuda from prominent collaterals and  brisk run off.  12/09/2017 ASSESSMENT:   Patient unresponsive on Morphine IV, Diaphoretic, Snoring, no apneic breathing noted. Pulse regular rate and rhythm, Breath sounds clear. No spontaneous movement of extremities. Nephew at bedside. Will continue comfort care.  PLAN  12/02/2017: Comfort care in progress Hopefully discharge to [facility] later today  HX OF STROKES: Small chronic infarct within left middle cerebellar peduncle  Other Active Problems: Principal Problem:   Acute ischemic left middle cerebral artery (MCA) stroke (HCC) Active Problems:   COPD with respiratory distress, acute (HCC)   Abnormal CXR   Hypertension, essential   DM (diabetes mellitus), type 2 with neurological complications (HCC)   Alcohol withdrawal delirium (HCC)  Aspiration pneumonia (HCC)   Acute embolic stroke (HCC)   Wheezing   Cerebrovascular accident (CVA) due to embolism of cerebral artery (HCC)   Acute respiratory failure with hypoxia (HCC)   Encounter for nasogastric (NG) tube placement   SOB (shortness of breath)   Palliative care encounter   Comfort measures only status   Terminal care    Hospital day # 8 VTE prophylaxis: SCD's  Diet : Diet NPO time specified   FAMILY UPDATES: family at bedside  TEAM UPDATES: Marvel PlanXu, Jindong, MD STATUS:  DNR with comfort care. Palliative following, Appreciate recs   Prior Home Stroke Medications:  aspirin 81 mg daily  Discharge Stroke Meds: N/A  [facility] [place]      Danel Studzinski A  Tatem Fesler, ANP-C Stroke Neurology Team 11/20/2017 10:41 AM   To contact Stroke Continuity provider, please refer to [email or web address]. After hours, contact General Neurology

## 2017-12-21 NOTE — Progress Notes (Signed)
Palliative Medicine RN Note: Afternoon symptom check. Pt is relaxed with less work of breathing. Morphine is at 6mg /hour. [place] [facility] [place] [place] Discussed w PMT PA; transfer is appropriate for today.  I attempted to call pt's nephew Tripp [phone]). No answer; left a message requesting a call back.    Patient will need bolus of morphine and ativan at time of transfer. Will obtain appropriate orders and discuss with RN Marylene LandAngela.  Margret ChanceMelanie G. Ilma Achee, RN, BSN, [facility] Palliative Medicine Team 12/11/2017 2:33 PM Office [phone]

## 2017-12-21 NOTE — Progress Notes (Signed)
Came into complete weaning patient off of O2 via [place] and O2 is off and [place] hanging on wall.  According to Tripp someone from Neurology had turned the O2 off.  15 breaths per minute at this time on room air.  Will continue to monitor.

## 2017-12-21 DEATH — deceased

## 2018-01-18 NOTE — ED Notes (Signed)
Alteplase medication changed on MAR to 1504 with Christine Chrisco, RN dual sign off to match when medication was administered in CT with Morgan Stanleyathan Batchelder.

## 2018-07-28 IMAGING — DX DG CHEST 1V PORT
2 series · 2 of 2 positions shown · non-contrast
Comparison: 12/01/2017, 11/30/2017, 11/28/2017

CLINICAL DATA: Short of breath

EXAM:
PORTABLE CHEST 1 VIEW

[chest ap (1 of 2)]
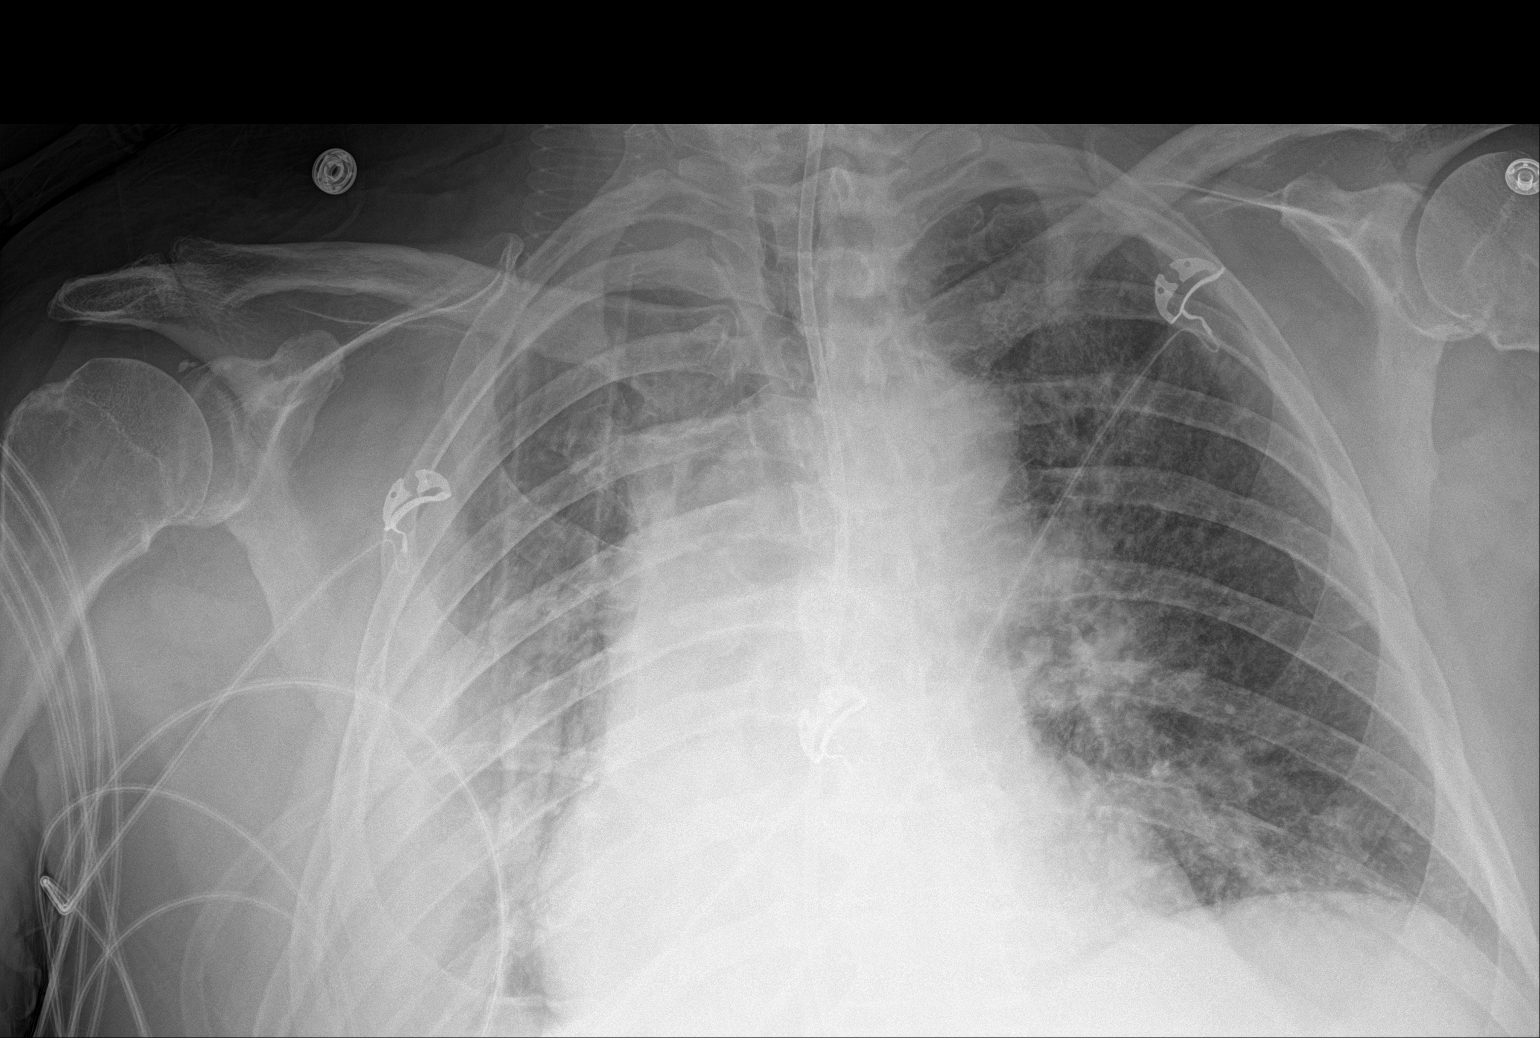

[chest ap (2 of 2)]
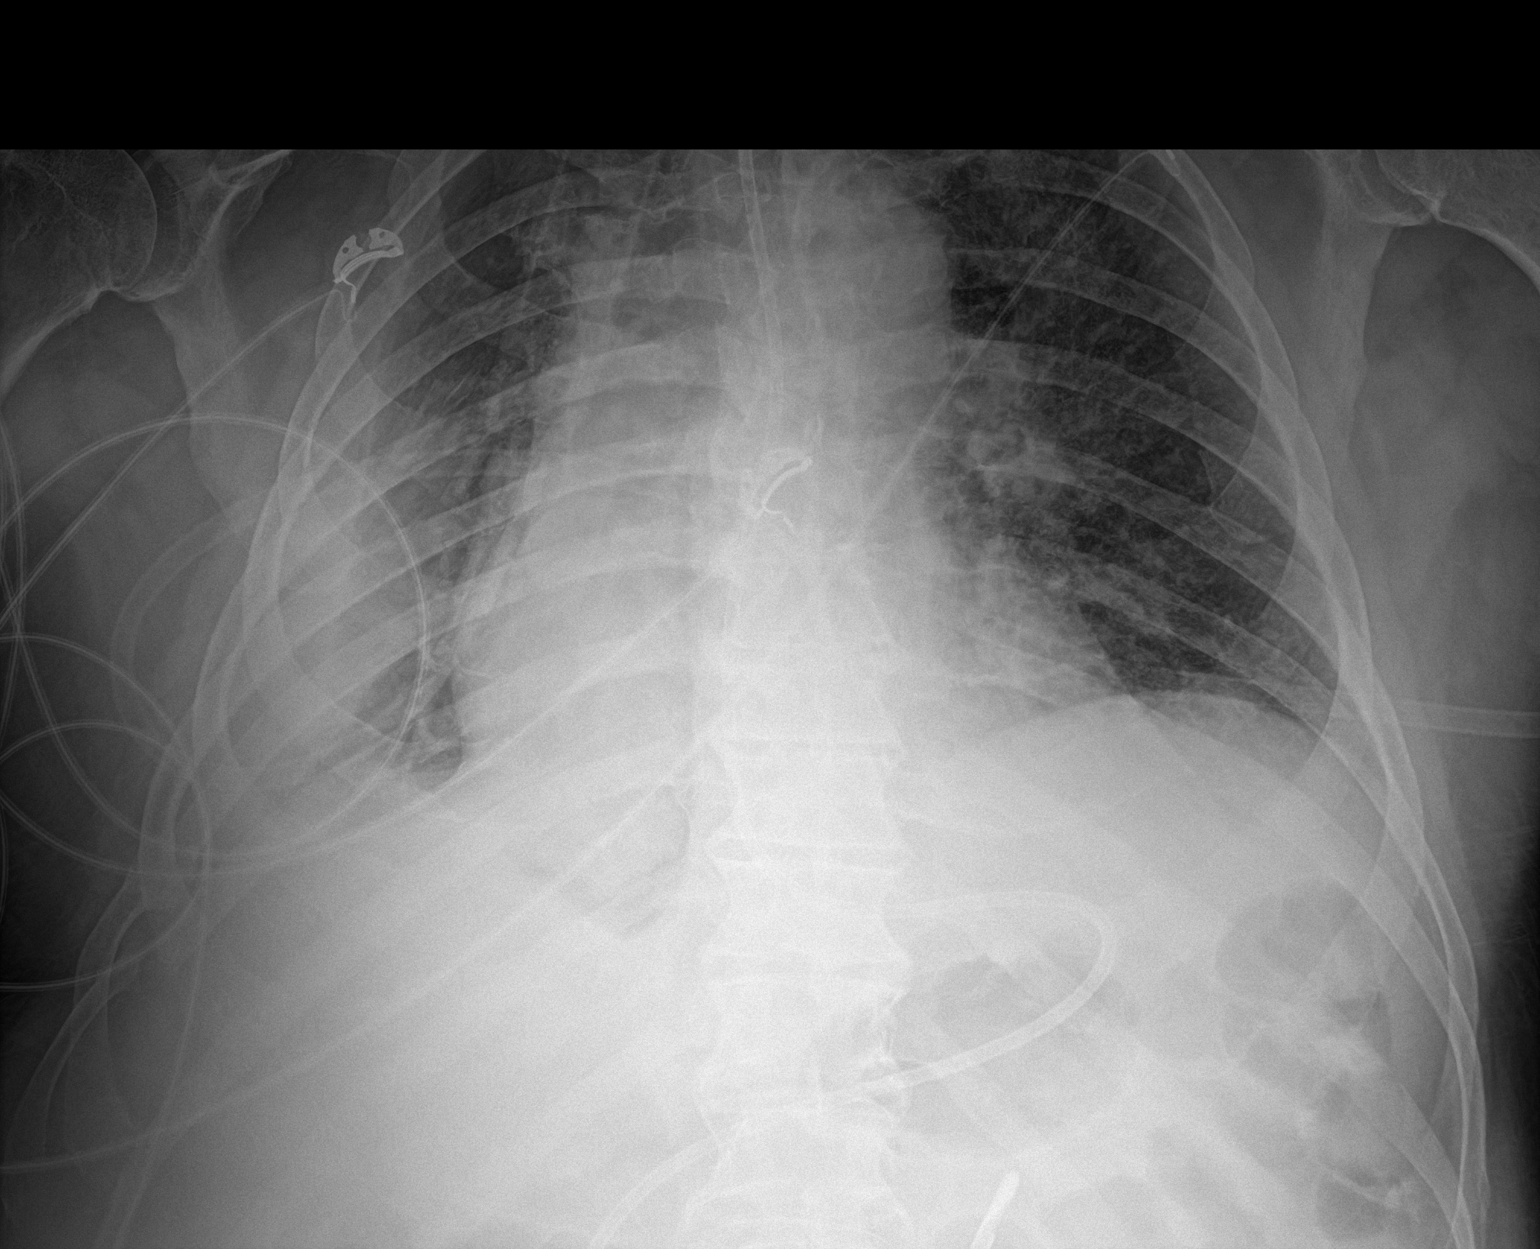

[2 of 2 positions shown; findings below may reference images not displayed]

FINDINGS: Esophageal tube tip overlies the expected location of ligament of
[place]. Stable to slight decrease in size of right pleural effusion
or thickening. Patchy bibasilar atelectasis or infiltrates. Stable
cardiomegaly. No pneumothorax.
IMPRESSION: 1. Stable to slight decrease in size of right pleural effusion or
thickening. Continued right greater than left bibasilar atelectasis
or infiltrates.
2. Continued cardiomegaly with mild vascular congestion and
interstitial edema.
